# Patient Record
Sex: Female | Born: 1965 | Race: White | Hispanic: No | Smoking: Former smoker
Health system: Southern US, Community
[De-identification: ages and names within clinical notes are randomized; demographics above are authoritative.]

## PROBLEM LIST (undated history)

## (undated) DIAGNOSIS — K219 Gastro-esophageal reflux disease without esophagitis: Secondary | ICD-10-CM

## (undated) DIAGNOSIS — T7840XA Allergy, unspecified, initial encounter: Secondary | ICD-10-CM

## (undated) DIAGNOSIS — M1990 Unspecified osteoarthritis, unspecified site: Secondary | ICD-10-CM

## (undated) HISTORY — DX: Allergy, unspecified, initial encounter: T78.40XA

## (undated) HISTORY — PX: WISDOM TOOTH EXTRACTION: SHX21

## (undated) HISTORY — DX: Unspecified osteoarthritis, unspecified site: M19.90

## (undated) HISTORY — PX: NO PAST SURGERIES: SHX2092

## (undated) HISTORY — DX: Gastro-esophageal reflux disease without esophagitis: K21.9

---

## 2004-10-01 ENCOUNTER — Emergency Department: Payer: Self-pay | Admitting: Emergency Medicine

## 2009-03-16 ENCOUNTER — Emergency Department: Payer: Self-pay | Admitting: Emergency Medicine

## 2010-04-24 ENCOUNTER — Other Ambulatory Visit: Payer: Self-pay | Admitting: Family Medicine

## 2016-08-14 ENCOUNTER — Encounter: Payer: Self-pay | Admitting: Family Medicine

## 2018-05-12 ENCOUNTER — Other Ambulatory Visit: Payer: Self-pay

## 2018-05-12 ENCOUNTER — Encounter: Payer: Self-pay | Admitting: Emergency Medicine

## 2018-05-12 ENCOUNTER — Ambulatory Visit
Admission: EM | Admit: 2018-05-12 | Discharge: 2018-05-12 | Disposition: A | Discharge status: Home / Self Care | Payer: BLUE CROSS/BLUE SHIELD | Attending: Family Medicine | Admitting: Family Medicine

## 2018-05-12 ENCOUNTER — Ambulatory Visit (INDEPENDENT_AMBULATORY_CARE_PROVIDER_SITE_OTHER): Payer: BLUE CROSS/BLUE SHIELD

## 2018-05-12 DIAGNOSIS — M25561 Pain in right knee: Secondary | ICD-10-CM | POA: Diagnosis not present

## 2018-05-12 DIAGNOSIS — M2341 Loose body in knee, right knee: Secondary | ICD-10-CM

## 2018-05-12 DIAGNOSIS — M1711 Unilateral primary osteoarthritis, right knee: Secondary | ICD-10-CM | POA: Diagnosis not present

## 2018-05-12 MED ORDER — NAPROXEN 500 MG PO TABS: 500.0000 mg | ORAL_TABLET | Freq: Two times a day (BID) | ORAL | 0 refills | Status: DC

## 2018-05-12 NOTE — ED Triage Notes (Addendum)
Patient in today c/o right knee pain x 1 day. Patient states she has had this before, but not to this severity. Patient states she feels like something is "out of place". Patient has been icing, elevating, using knee brace and Ibuprofen without relief.

## 2018-05-12 NOTE — ED Provider Notes (Signed)
[facility]    CSN: [id number] Arrival date & time: 05/12/18  2426     History   Chief Complaint Chief Complaint  Patient presents with  . Knee Pain    right    HPI Julia Gallegos is a 52 y.o. female.   HPI  52 year old morbidly obese female presents with right knee pain that she has had for 1 day.  She sits work  but yesterday had to walk more than usual.  Since that time she has had knee pain which she cannot localize very well.  She states that anytime she walks on it it is very painful.  4 years ago she was seen by a PA who told her that she would need a total knee arthroplasty but she has been resisting having to do that.  She took anti-inflammatory medications which helped and she has not really had much of a problem over the last 4 years.  States that it does pop and click frequently.  He was using a cane yesterday for ambulation.  She started a job recently and states that she does not have time for any surgery.  She has been using ice and elevating and using a knee brace along with ibuprofen but has not really helped the knee very much.        History reviewed. No pertinent past medical history.  There are no active problems to display for this patient.   History reviewed. No pertinent surgical history.  OB History   None      Home Medications    Prior to Admission medications   Medication Sig Start Date End Date Taking? Authorizing Provider  naproxen (NAPROSYN) 500 MG tablet Take 1 tablet (500 mg total) by mouth 2 (two) times daily with a meal. 05/12/18   Lorin Picket, PA-C    Family History Family History  Problem Relation Age of Onset  . Pneumonia Mother 37  . COPD Father     Social History Social History   Tobacco Use  . Smoking status: Former Smoker    Last attempt to quit: 08/27/2016    Years since quitting: 1.7  . Smokeless tobacco: Never Used  Substance Use Topics  . Alcohol use: Yes    Comment: occassionally  . Drug  use: Never     Allergies   Vicodin [hydrocodone-acetaminophen]   Review of Systems Review of Systems  Constitutional: Positive for activity change. Negative for appetite change, chills, fatigue and fever.  Musculoskeletal: Positive for arthralgias and gait problem.  All other systems reviewed and are negative.    Physical Exam Triage Vital Signs ED Triage Vitals  Enc Vitals Group     BP 05/12/18 0845 138/80     Pulse Rate 05/12/18 0845 95     Resp 05/12/18 0845 16     Temp 05/12/18 0845 98.7 F (37.1 C)     Temp Source 05/12/18 0845 Oral     SpO2 05/12/18 0845 99 %     Weight 05/12/18 0845 240 lb (108.9 kg)     Height 05/12/18 0845 5\' 1"  (1.549 m)     Head Circumference --      Peak Flow --      Pain Score 05/12/18 0844 3     Pain Loc --      Pain Edu? --      Excl. in [place]? --    No data found.  Updated Vital Signs BP 138/80 (BP Location: Left Arm)  Pulse 95   Temp 98.7 F (37.1 C) (Oral)   Resp 16   Ht 5\' 1"  (1.549 m)   Wt 240 lb (108.9 kg)   SpO2 99%   BMI 45.35 kg/m   Visual Acuity Right Eye Distance:   Left Eye Distance:   Bilateral Distance:    Right Eye Near:   Left Eye Near:    Bilateral Near:     Physical Exam  Constitutional: She is oriented to person, place, and time. She appears well-developed and well-nourished. No distress.  HENT:  Head: Normocephalic.  Eyes: Pupils are equal, round, and reactive to light. Right eye exhibits no discharge. Left eye exhibits no discharge.  Neck: Normal range of motion.  Musculoskeletal: She exhibits tenderness.  Examination of the right knee shows  No significant effusion present.  There is good control of the quadriceps.  She lacks approximately 5 degrees of full extension due to discomfort.  Flexes to 90 degrees.  She has pain with patellar manipulation and retropatellar tenderness bilaterally.  There is tenderness along the lateral and medial joint lines.  Collateral ligaments are intact to stressing.   She has a negative anterior drawer sign.  Neurological: She is alert and oriented to person, place, and time.  Skin: Skin is warm and dry. She is not diaphoretic.  Psychiatric: She has a normal mood and affect. Her behavior is normal. Judgment and thought content normal.  Nursing note and vitals reviewed.    UC Treatments / Results  Labs (all labs ordered are listed, but only abnormal results are displayed) Labs Reviewed - No data to display  EKG None  Radiology Dg Knee Complete 4 Views Right  Result Date: 05/12/2018 CLINICAL DATA:  52 year old female with right knee pain for years. Osteoarthritis. Pain last 2 days. No reported injury. Initial encounter. EXAM: RIGHT KNEE - COMPLETE 4+ VIEW COMPARISON:  03/16/2009 plain film exam. FINDINGS: Marked medial tibiofemoral joint space and patellofemoral joint degenerative changes. Mild to moderate lateral tibiofemoral joint degenerative changes. Small loose body suspected measuring up to 9 mm. No fracture or dislocation. No significant joint effusion. IMPRESSION: Progressive degenerative changes when compared to 2010 exam including: Marked medial tibiofemoral joint space and patellofemoral joint degenerative changes. Mild to moderate lateral tibiofemoral joint degenerative changes. Small loose body suspected measuring up to 9 mm. Electronically Signed   By: Genia Del M.D.   On: 05/12/2018 09:44    Procedures Procedures (including critical care time)  Medications Ordered in UC Medications - No data to display  Initial Impression / Assessment and Plan / UC Course  I have reviewed the triage vital signs and the nursing notes.  Pertinent labs & imaging results that were available during my care of the patient were reviewed by me and considered in my medical decision making (see chart for details).   Had a long discussion with the patient regarding the findings and her osteoarthritis of the knee.  Advised she will eventually require a total  knee arthroplasty.  However she is reluctant to have this done at this time due mainly to her new job and down time required for rehab.  Not want to undergo steroid injections in the knee at this time either.  I will place her on Naprosyn daily basis and recommend that she take Prilosec or Prevacid for gastric protection.  Do use a cane for ambulation.  Use it in the opposite hand.  Commended that she follow-up with an orthopedic surgeon for ongoing care and timing  of a total knee arthroplasty   Final Clinical Impressions(s) / UC Diagnoses   Final diagnoses:  Primary osteoarthritis of right knee  Loose body of knee, right     Discharge Instructions     Use cane for ambulation.Apply ice 20 minutes out of every 2 hours 4-5 times daily for comfort.  Follow up with orthopedic surgery    ED Prescriptions    Medication Sig Dispense Auth. Provider   naproxen (NAPROSYN) 500 MG tablet Take 1 tablet (500 mg total) by mouth 2 (two) times daily with a meal. 60 tablet Lorin Picket, PA-C     Controlled Substance Prescriptions [place] Controlled Substance Registry consulted? Not Applicable   Lorin Picket, PA-C 05/12/18 1052

## 2018-05-12 NOTE — Discharge Instructions (Signed)
Use cane for ambulation.Apply ice 20 minutes out of every 2 hours 4-5 times daily for comfort.  Follow up with orthopedic surgery

## 2018-05-25 ENCOUNTER — Encounter: Payer: Self-pay | Admitting: Emergency Medicine

## 2018-05-25 ENCOUNTER — Ambulatory Visit
Admission: EM | Admit: 2018-05-25 | Discharge: 2018-05-25 | Disposition: A | Discharge status: Home / Self Care | Payer: BLUE CROSS/BLUE SHIELD | Attending: Family Medicine | Admitting: Family Medicine

## 2018-05-25 ENCOUNTER — Ambulatory Visit (INDEPENDENT_AMBULATORY_CARE_PROVIDER_SITE_OTHER): Payer: BLUE CROSS/BLUE SHIELD

## 2018-05-25 ENCOUNTER — Other Ambulatory Visit: Payer: Self-pay

## 2018-05-25 DIAGNOSIS — S43421A Sprain of right rotator cuff capsule, initial encounter: Secondary | ICD-10-CM

## 2018-05-25 DIAGNOSIS — W19XXXA Unspecified fall, initial encounter: Secondary | ICD-10-CM

## 2018-05-25 DIAGNOSIS — M25511 Pain in right shoulder: Secondary | ICD-10-CM

## 2018-05-25 DIAGNOSIS — M79601 Pain in right arm: Secondary | ICD-10-CM | POA: Diagnosis not present

## 2018-05-25 NOTE — Discharge Instructions (Addendum)
Apply ice 20 minutes out of every 2 hours 4-5 times daily for comfort.  Perform pendulum exercises as was demonstrated to you a minimum of 3 times daily for 1 to 2 minutes each time.  Avoid frequent or repetitive use of the shoulder until seen by orthopedics.

## 2018-05-25 NOTE — ED Provider Notes (Signed)
[facility]    CSN: [id number] Arrival date & time: 05/25/18  1813     History   Chief Complaint Chief Complaint  Patient presents with  . Shoulder Pain    right    HPI Julia Gallegos is a 52 y.o. female.   HPI  52 year old female to the clinic presents today right arm and shoulder pain.  She states that she misstep on concrete tripped and fell onto her right arm while leaving work today.  Approximately 5:00  pm  She is unable to lift her arm.  She is right-hand dominant.  Pain is over the proximal humerus also in the sub-deltoid area; good pulses and sensation distally.       History reviewed. No pertinent past medical history.  There are no active problems to display for this patient.   Past Surgical History:  Procedure Laterality Date  . NO PAST SURGERIES      OB History   None      Home Medications    Prior to Admission medications   Medication Sig Start Date End Date Taking? Authorizing Provider  ibuprofen (ADVIL) 200 MG tablet Take 200 mg by mouth every 6 (six) hours as needed.   Yes [provider]    Family History Family History  Problem Relation Age of Onset  . Pneumonia Mother 56  . COPD Father     Social History Social History   Tobacco Use  . Smoking status: Former Smoker    Last attempt to quit: 08/27/2016    Years since quitting: 1.7  . Smokeless tobacco: Never Used  Substance Use Topics  . Alcohol use: Yes    Comment: occassionally  . Drug use: Never     Allergies   Vicodin [hydrocodone-acetaminophen]   Review of Systems Review of Systems  Constitutional: Positive for activity change. Negative for appetite change, chills, fatigue and fever.  Musculoskeletal: Positive for arthralgias and myalgias.  All other systems reviewed and are negative.    Physical Exam Triage Vital Signs ED Triage Vitals  Enc Vitals Group     BP 05/25/18 1827 140/75     Pulse --      Resp 05/25/18 1827 17     Temp  05/25/18 1827 98.4 F (36.9 C)     Temp Source 05/25/18 1827 Oral     SpO2 05/25/18 1827 100 %     Weight 05/25/18 1826 230 lb (104.3 kg)     Height 05/25/18 1826 5' (1.524 m)     Head Circumference --      Peak Flow --      Pain Score 05/25/18 1825 4     Pain Loc --      Pain Edu? --      Excl. in [place]? --    No data found.  Updated Vital Signs BP 140/75 (BP Location: Left Arm)   Temp 98.4 F (36.9 C) (Oral)   Resp 17   Ht 5' (1.524 m)   Wt 230 lb (104.3 kg)   SpO2 100%   BMI 44.92 kg/m   Visual Acuity Right Eye Distance:   Left Eye Distance:   Bilateral Distance:    Right Eye Near:   Left Eye Near:    Bilateral Near:     Physical Exam  Constitutional: She is oriented to person, place, and time. She appears well-developed and well-nourished. No distress.  HENT:  Head: Normocephalic.  Eyes: Pupils are equal, round, and reactive to light.  EOM are normal.  Neck: Normal range of motion. Neck supple.  Musculoskeletal: She exhibits tenderness.  Is holding the right arm at her side with resistance to any motion.  Scapula is nontender and no deformity is seen.  The acromion is also intact with no deformity no pain.  Subacromial is mild tenderness but not a lot.  She has most tenderness at the insertion of the deltoid on the humerus.  She is able to passively allow small amount of range of motion of the shoulder.  Sensation and pulses are intact distally.  Will not actively move the shoulder through range of motion.  Neurological: She is alert and oriented to person, place, and time.  Skin: Skin is warm and dry. She is not diaphoretic.  Psychiatric: She has a normal mood and affect. Her behavior is normal. Judgment and thought content normal.  Nursing note and vitals reviewed.    UC Treatments / Results  Labs (all labs ordered are listed, but only abnormal results are displayed) Labs Reviewed - No data to display  EKG None  Radiology Dg Shoulder Right  Result Date:  05/25/2018 CLINICAL DATA:  [place] onto right shoulder EXAM: RIGHT SHOULDER - 2+ VIEW COMPARISON:  None. FINDINGS: Right lung apex is clear. AC joint is intact. No fracture or malalignment. IMPRESSION: No acute osseous abnormality. Electronically Signed   By: Donavan Foil M.D.   On: 05/25/2018 19:15   Dg Humerus Right  Result Date: 05/25/2018 CLINICAL DATA:  Fall onto right arm EXAM: RIGHT HUMERUS - 2+ VIEW COMPARISON:  None. FINDINGS: There is no evidence of fracture or other focal bone lesions. Soft tissues are unremarkable. IMPRESSION: Negative. Electronically Signed   By: Donavan Foil M.D.   On: 05/25/2018 19:15    Procedures Procedures (including critical care time)  Medications Ordered in UC Medications - No data to display  Initial Impression / Assessment and Plan / UC Course  I have reviewed the triage vital signs and the nursing notes.  Pertinent labs & imaging results that were available during my care of the patient were reviewed by me and considered in my medical decision making (see chart for details).     Plan: 1. Test/x-ray results and diagnosis reviewed with patient 2. rx as per orders; risks, benefits, potential side effects reviewed with patient 3. Recommend supportive treatment with ice to the shoulder 20 minutes out of every 2 hours 4-5 times daily.  Use a sling for comfort.  Recommend sitting in a recliner chair for sleep.  Directed and pendulum exercises that she will perform at least 3 times daily for 1 to 2 minutes each time.  Also recommend following up with orthopedic surgery since the exam today is too painful to perform adequately.  She  ironically has an appointment tomorrow with the orthopedic surgeon for her knee 4. F/u prn if symptoms worsen or don't improve  Final Clinical Impressions(s) / UC Diagnoses   Final diagnoses:  Fall, initial encounter  Sprain of right rotator cuff capsule, initial encounter     Discharge Instructions     Apply ice 20  minutes out of every 2 hours 4-5 times daily for comfort.  Perform pendulum exercises as was demonstrated to you a minimum of 3 times daily for 1 to 2 minutes each time.  Avoid frequent or repetitive use of the shoulder until seen by orthopedics.    ED Prescriptions    None     Controlled Substance Prescriptions [place] Controlled Substance Registry consulted?  Not Applicable   Lorin Picket, PA-C 05/25/18 1950

## 2018-05-25 NOTE — ED Triage Notes (Signed)
Pt here today c/o right arm/shoulder pain. She fell on concrete on her right arm while leaving work. She can not lift up her arm she has to use the other arm to assist it.

## 2018-06-26 ENCOUNTER — Ambulatory Visit (INDEPENDENT_AMBULATORY_CARE_PROVIDER_SITE_OTHER): Payer: BLUE CROSS/BLUE SHIELD

## 2018-06-26 ENCOUNTER — Other Ambulatory Visit: Payer: Self-pay

## 2018-06-26 ENCOUNTER — Ambulatory Visit
Admission: EM | Admit: 2018-06-26 | Discharge: 2018-06-26 | Disposition: A | Discharge status: Home / Self Care | Payer: BLUE CROSS/BLUE SHIELD | Attending: Family Medicine | Admitting: Family Medicine

## 2018-06-26 DIAGNOSIS — M1712 Unilateral primary osteoarthritis, left knee: Secondary | ICD-10-CM

## 2018-06-26 DIAGNOSIS — M25462 Effusion, left knee: Secondary | ICD-10-CM

## 2018-06-26 DIAGNOSIS — M25562 Pain in left knee: Secondary | ICD-10-CM

## 2018-06-26 MED ORDER — MELOXICAM 15 MG PO TABS: 15.0000 mg | ORAL_TABLET | Freq: Every day | ORAL | 0 refills | Status: DC | PRN

## 2018-06-26 NOTE — ED Triage Notes (Signed)
Patient complains of left knee pain that started over the weekend. Patient states that Monday night she was walking with it and heard a pop. Patient states that she has bad knees in general. Patient states that this feels different.

## 2018-06-26 NOTE — Discharge Instructions (Addendum)
Take medication as prescribed. Rest. Drink plenty of fluids.  Continue to use crutches and tolerate weight as able.  Call today to schedule follow-up with orthopedic.  Orthopedic follow-up is important.  Follow up with your primary care physician this week as needed. Return to Urgent care for new or worsening concerns.

## 2018-06-26 NOTE — ED Provider Notes (Signed)
[facility] ____________________________________________  Time seen: Approximately 9:11 AM  I have reviewed the triage vital signs and the nursing notes.   HISTORY  Chief Complaint Knee Pain (left)   HPI Julia Gallegos is a 52 y.o. female presenting with spouse at bedside for evaluation of left knee pain is been present since this past Saturday night.  States she is gotten up to go to the restroom that night, when getting up from a seated position she heard a pop to her left knee with associated pain.  States Sunday she was able to continue weightbearing but with mild pain.  States since Tuesday she has not been able to really weight-bear.  States that she has had continued pain to her left knee with difficulty weightbearing.  Unsure if knee is unstable feeling but just more pain.  Denies any fall or trauma directly to the knee.  States that she knows she has "bad knees "and that she has diffuse arthritis to her right knee and needs a knee replacement.  Previously followed with Dr.[facility].  Has been using crutches.  Has been taken over-the-counter ibuprofen without much change as well as elevating applying ice.  Denies pain radiation, paresthesias, other pain or swelling.  States feels like there is some swelling to the knee itself but not otherwise.  Denies other aggravating or alleviating factors.  Reports otherwise feels well.  History reviewed. No pertinent past medical history. Denies   There are no active problems to display for this patient.   Past Surgical History:  Procedure Laterality Date  . NO PAST SURGERIES       No current facility-administered medications for this encounter.   Current Outpatient Medications:  .  ibuprofen (ADVIL) 200 MG tablet, Take 200 mg by mouth every 6 (six) hours as needed., Disp: , Rfl:  .  meloxicam (MOBIC) 15 MG tablet, Take 1 tablet (15 mg total) by mouth daily as needed., Disp: 10 tablet, Rfl: 0  Allergies Vicodin  [hydrocodone-acetaminophen]  Family History  Problem Relation Age of Onset  . Pneumonia Mother 37  . COPD Father     Social History Social History   Tobacco Use  . Smoking status: Former Smoker    Last attempt to quit: 08/27/2016    Years since quitting: 1.8  . Smokeless tobacco: Never Used  Substance Use Topics  . Alcohol use: Yes    Comment: occasionally  . Drug use: Never    Review of Systems Constitutional: No fever/chills ENT: No sore throat. Cardiovascular: Denies chest pain. Respiratory: Denies shortness of breath. Gastrointestinal: No abdominal pain.   Musculoskeletal: Negative for back pain. As above.  Skin: Negative for rash.   ____________________________________________   PHYSICAL EXAM:  VITAL SIGNS: ED Triage Vitals  Enc Vitals Group     BP 06/26/18 0847 (!) 145/83     Pulse Rate 06/26/18 0847 (!) 105     Resp 06/26/18 0847 18     Temp 06/26/18 0847 98.7 F (37.1 C)     Temp Source 06/26/18 0847 Oral     SpO2 06/26/18 0847 98 %     Weight 06/26/18 0845 229 lb 15.9 oz (104.3 kg)     Height 06/26/18 0845 5' (1.524 m)     Head Circumference --      Peak Flow --      Pain Score 06/26/18 0844 10     Pain Loc --      Pain Edu? --  Excl. in [place]? --     Constitutional: Alert and oriented. Well appearing and in no acute distress. ENT      Head: Normocephalic and atraumatic. Cardiovascular: Normal rate, regular rhythm. Grossly normal heart sounds.  Good peripheral circulation. Respiratory: Normal respiratory effort without tachypnea nor retractions. Breath sounds are clear and equal bilaterally. No wheezes, rales, rhonchi. Musculoskeletal:  Bilateral pedal pulses equal and easily palpated. Left lateral knee diffuse tenderness to palpation and palpable effusion, mild pain with anterior and posterior drawer but no instability noted, no pain with medial lateral stress, no pain or restriction with flexion, mild pain with knee extension and slightly  limited extension, left lower extremity otherwise nontender no other edema noted.  Gait not tested due to pain. Neurologic:  Normal speech and language.  Speech is normal.  Skin:  Skin is warm, dry and intact. Psychiatric: Mood and affect are normal. Speech and behavior are normal. Patient exhibits appropriate insight and judgment   ___________________________________________   LABS (all labs ordered are listed, but only abnormal results are displayed)  Labs Reviewed - No data to display  RADIOLOGY  Dg Knee Complete 4 Views Left  Result Date: 06/26/2018 CLINICAL DATA:  Left knee pain. EXAM: LEFT KNEE - COMPLETE 4+ VIEW COMPARISON:  No recent prior. FINDINGS: Severe tricompartment degenerative change. No acute bony abnormality. No evidence of fracture or dislocation. Moderate knee joint effusion. IMPRESSION: Severe tricompartment degenerative change. No acute bony abnormality identified. No evidence of fracture dislocation. Moderate knee joint effusion. Electronically Signed   By: Marcello Moores  Register   On: 06/26/2018 09:59   ____________________________________________   PROCEDURES Procedures    INITIAL IMPRESSION / ASSESSMENT AND PLAN / ED COURSE  Pertinent labs & imaging results that were available during my care of the patient were reviewed by me and considered in my medical decision making (see chart for details).  Well-appearing patient.  No acute distress.  Left knee pain after getting up from sitting position with pop.  Difficulty weightbearing.  X-ray as above with joint effusion and tricompartmental arthritis.  Discussed pain associated to osteoarthritis, but also concern for ligamentous and meniscal injury.  Patient previously seen Dr. Marry Guan, recommend orthopedic follow-up.  Continue crutches as needed.  Will start on daily Mobic, ice and elevation.Discussed indication, risks and benefits of medications with patient.  Discussed follow up with Primary care physician this week as  needed. Discussed follow up and return parameters including no resolution or any worsening concerns. Patient verbalized understanding and agreed to plan.   ____________________________________________   FINAL CLINICAL IMPRESSION(S) / ED DIAGNOSES  Final diagnoses:  Acute pain of left knee  Osteoarthritis of left knee, unspecified osteoarthritis type     ED Discharge Orders         Ordered    meloxicam (MOBIC) 15 MG tablet  Daily PRN     06/26/18 1003           Note: This dictation was prepared with Dragon dictation along with smaller phrase technology. Any transcriptional errors that result from this process are unintentional.         Marylene Land, NP 06/26/18 1126

## 2019-01-24 IMAGING — CR DG KNEE COMPLETE 4+V*L*
4 series · 4 of 4 positions shown · non-contrast
Comparison: No recent prior.

CLINICAL DATA: Left knee pain.

EXAM:
LEFT KNEE - COMPLETE 4+ VIEW

[knee ap (1 of 3)]
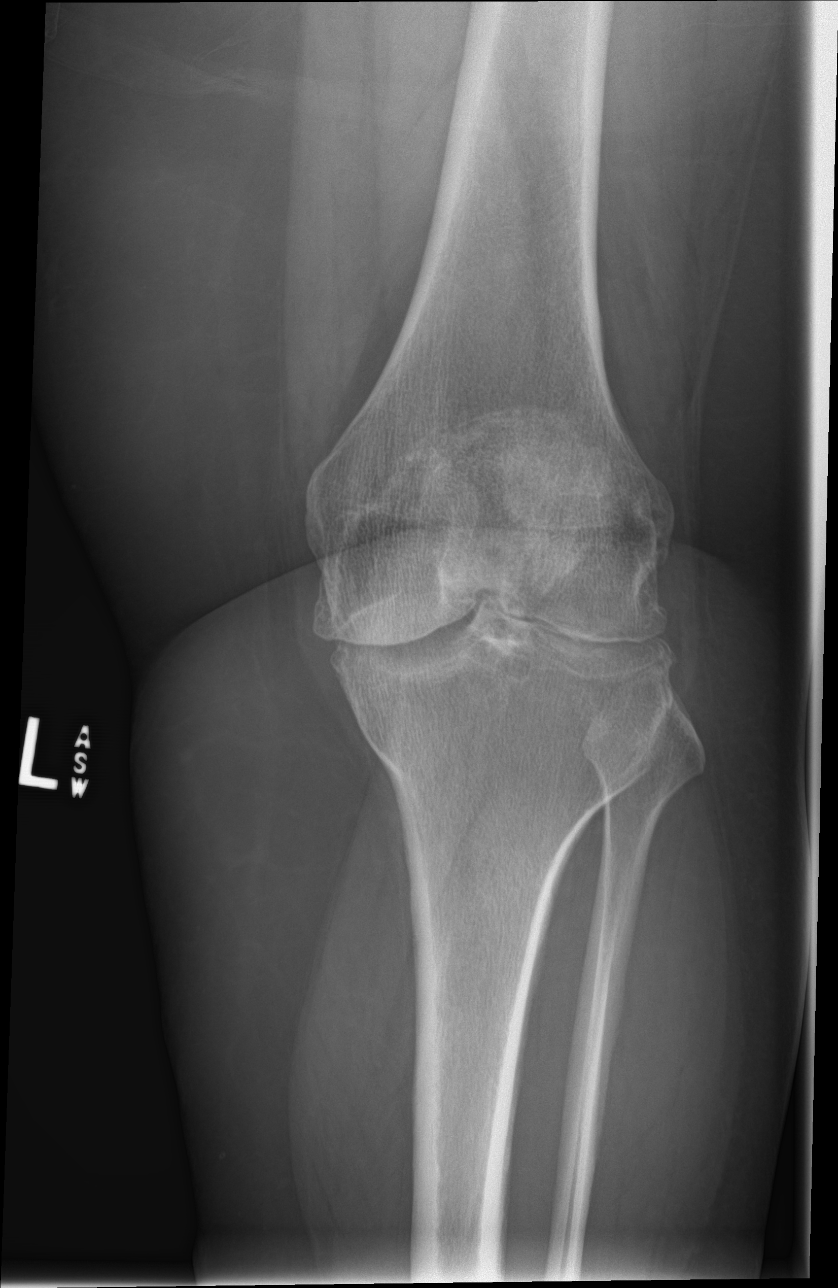

[tunnel]
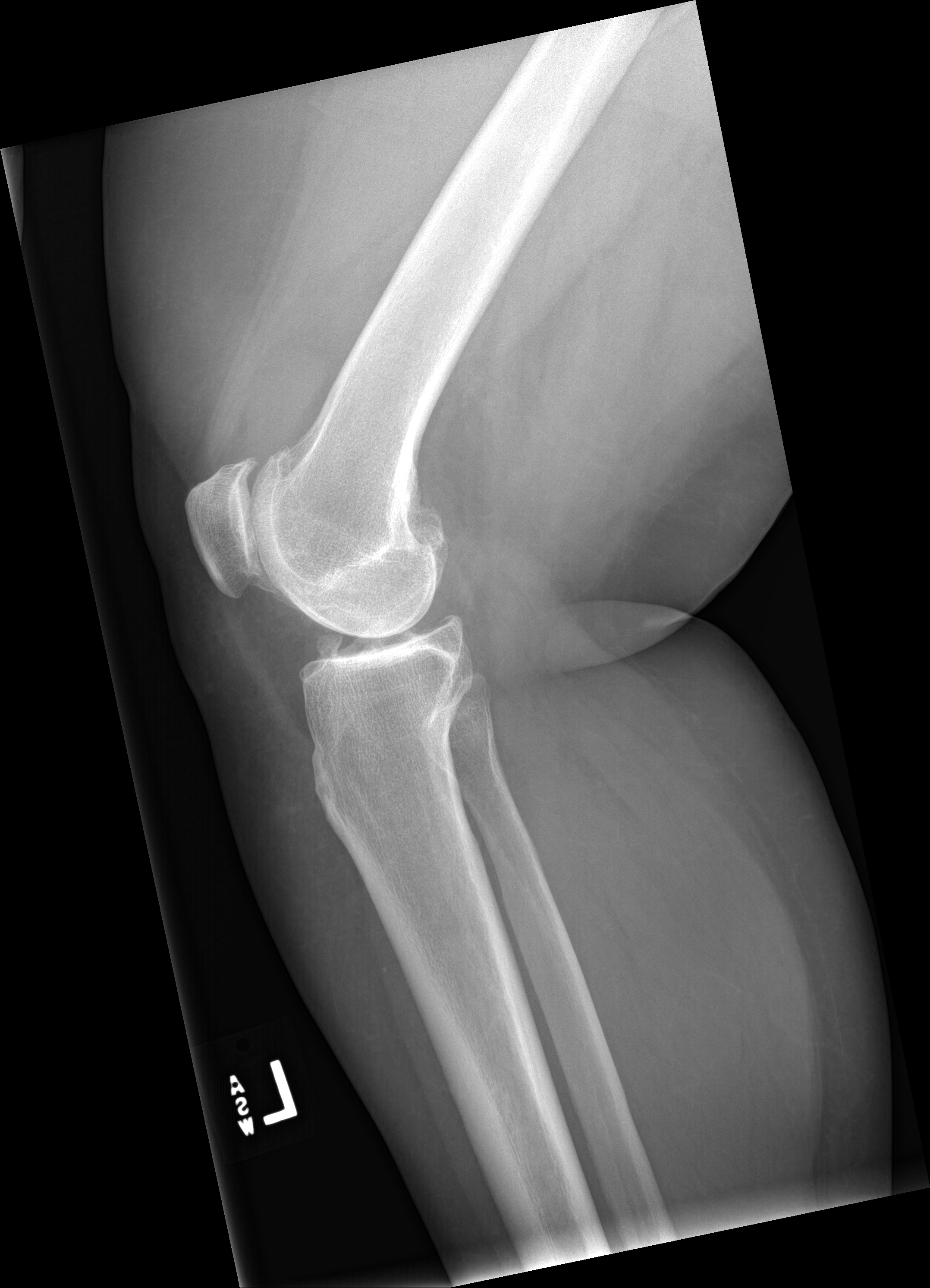

[knee ap (2 of 3)]
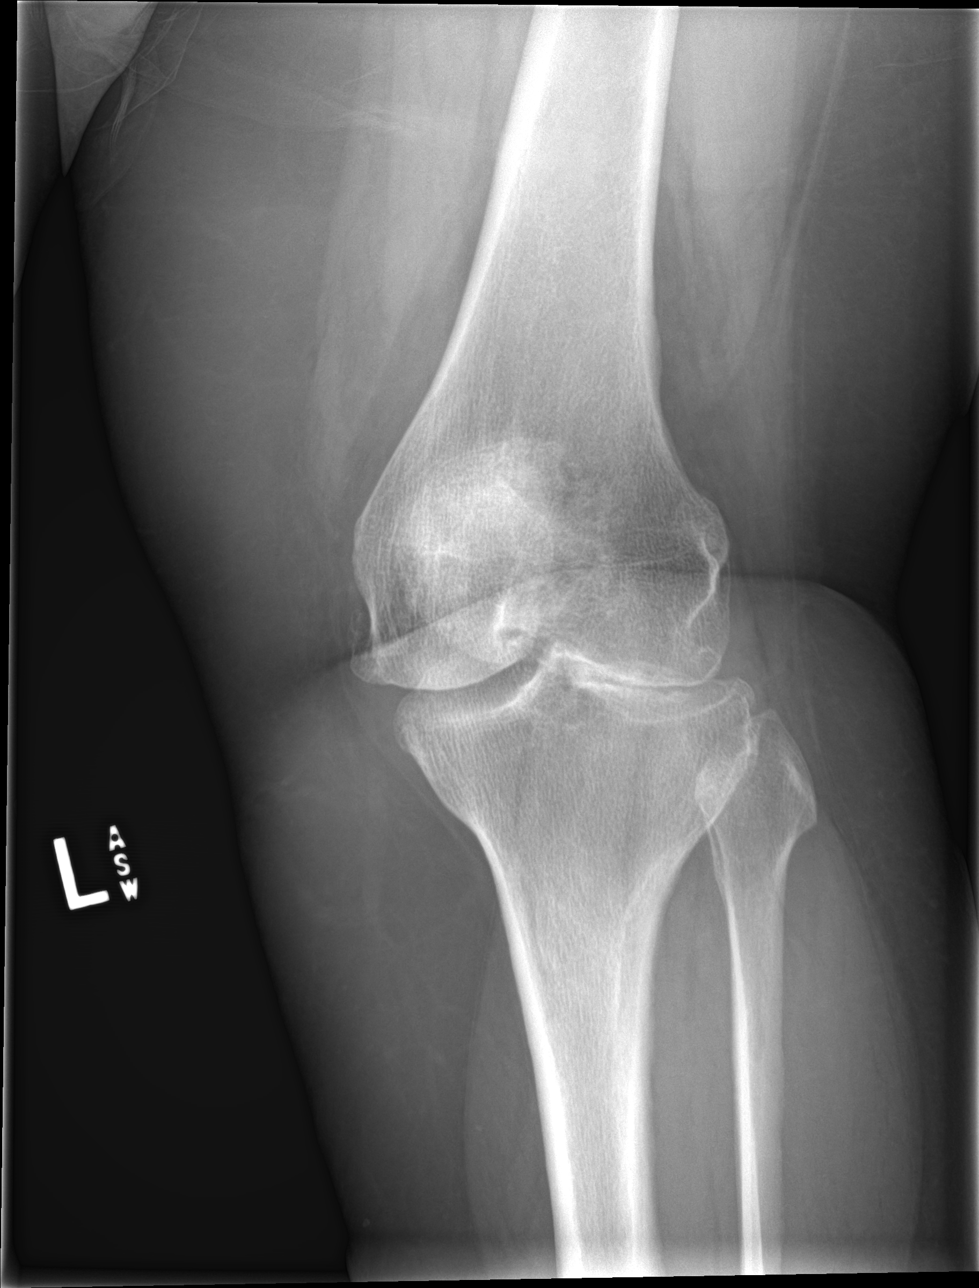

[knee ap (3 of 3)]
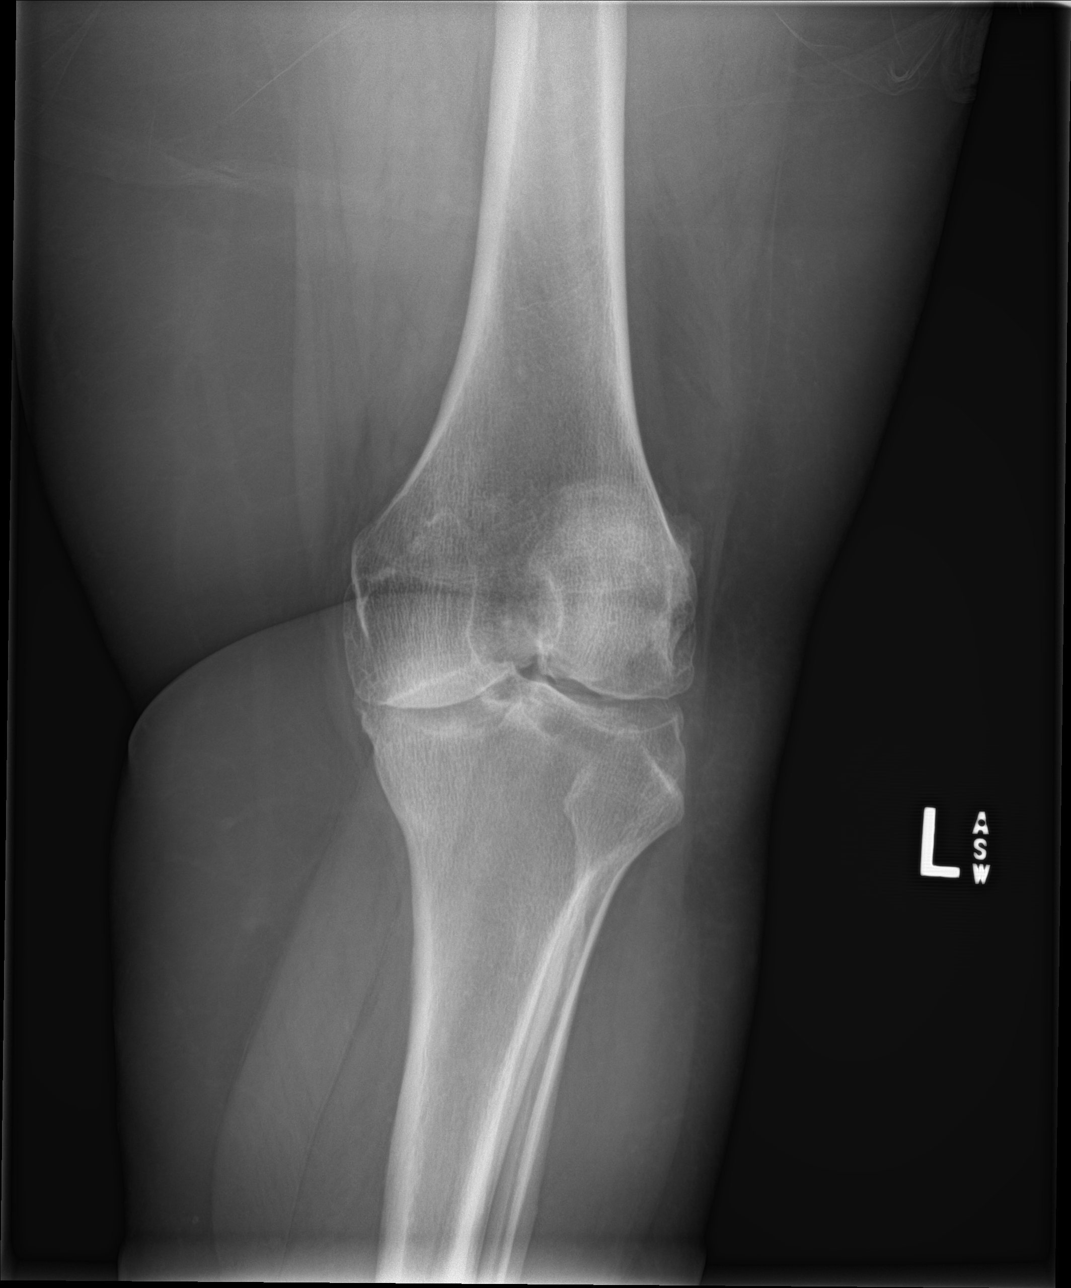

[4 of 4 positions shown; findings below may reference images not displayed]

FINDINGS: Severe tricompartment degenerative change. No acute bony
abnormality. No evidence of fracture or dislocation. Moderate knee
joint effusion.
IMPRESSION: Severe tricompartment degenerative change. No acute bony abnormality
identified. No evidence of fracture dislocation. Moderate knee joint
effusion..

## 2019-05-05 ENCOUNTER — Telehealth: Payer: Self-pay | Admitting: Obstetrics and Gynecology

## 2019-05-05 NOTE — Telephone Encounter (Signed)
Patient scheduled 8/19 for nexplanon replacement with [facility] in [place].

## 2019-05-06 NOTE — Telephone Encounter (Signed)
Noted. Will order to arrive by apt date/time. 

## 2019-06-16 ENCOUNTER — Other Ambulatory Visit: Payer: Self-pay

## 2019-06-16 ENCOUNTER — Ambulatory Visit: Payer: BC Managed Care – PPO | Admitting: Obstetrics and Gynecology

## 2019-06-16 ENCOUNTER — Encounter: Payer: Self-pay | Admitting: Obstetrics and Gynecology

## 2019-06-16 VITALS — BP 147/92 | Wt 248.0 lb

## 2019-06-16 DIAGNOSIS — Z3049 Encounter for surveillance of other contraceptives: Secondary | ICD-10-CM | POA: Diagnosis not present

## 2019-06-16 DIAGNOSIS — Z30017 Encounter for initial prescription of implantable subdermal contraceptive: Secondary | ICD-10-CM

## 2019-06-16 DIAGNOSIS — Z3046 Encounter for surveillance of implantable subdermal contraceptive: Secondary | ICD-10-CM

## 2019-06-16 MED ORDER — ETONOGESTREL 68 MG ~~LOC~~ IMPL: 1.0000 | DRUG_IMPLANT | Freq: Once | SUBCUTANEOUS | 0 refills | Status: DC

## 2019-06-16 NOTE — Progress Notes (Signed)
GYNECOLOGY PROCEDURE NOTE (Nexplanon removal)  Nexplanon removal discussed in detail.  Risks of infection, bleeding, nerve injury all reviewed.  Patient understands risks and desires to proceed.  Verbal consent obtained.  Patient is certain she wants the Nexplanon removed.  All questions answered.  Procedure: Patient placed in dorsal supine with left arm above head, elbow flexed at 90 degrees, arm resting on examination table.  Nexplanon identified without problems.  Betadine scrub x3.  1 ml of 1% lidocaine injected under Nexplanon device without problems.  Sterile gloves applied.  Small 0.5cm incision made at distal tip of Nexplanon device with 11 blade scalpel.  Nexplanon brought to incision and grasped with a small kelly clamp.  Nexplanon removed intact without problems.      GYNECOLOGY PROCEDURE NOTE (Nexplanon insertion)  Patient is a 53 y.o. G2P2 presenting for Nexplanon insertion as her desires means of contraception.  She provided informed consent, signed copy in the chart, time out was performed. Pregnancy test was not done, with self reported LMP of No LMP recorded. Patient has had an implant.  She understands that Nexplanon is a progesterone only therapy, and that patients often patients have irregular and unpredictable vaginal bleeding or amenorrhea. She understands that other side effects are possible related to systemic progesterone, including but not limited to, headaches, breast tenderness, nausea, and irritability. While effective at preventing pregnancy long acting reversible contraceptives do not prevent transmission of sexually transmitted diseases and use of barrier methods for this purpose was discussed. The placement procedure for Nexplanon was reviewed with the patient in detail including risks of nerve injury, infection, bleeding and injury to other muscles or tendons. She understands that the Nexplanon implant is good for 3 years and needs to be removed at the end of  that time.  She understands that Nexplanon is an extremely effective option for contraception, with failure rate of <1%. This information is reviewed today and all questions were answered. Informed consent was obtained, both verbally and written.   The patient is healthy and has no contraindications to Nexplanon use. Urine pregnancy test was performed today and was negative.  Procedure Appropriate time out taken.  Patient already placed in dorsal supine with left arm above head, elbow flexed at 90 degrees, arm resting on examination table with hand behind her head.  The patient was counseled extensively regarding using the new insertion site location versus simply placing the device where the old device was.  She strongly wanted the device placed in the same incision without making a new incision.  The insertion site was previuosly prepped with two betadine swabs and then injected with and addition 2 mL of 1% lidocaine without epinephrine.  Nexplanon removed form sterile blister packaging,  Device confirmed in needle, before inserting full length of needle, tenting up the skin as the needle was advance.  The drug eluting rod was then deployed by pulling back the slider per the manufactures recommendation.  The implant was palpable by the clinician as well as the patient.  The insertion site covered dressed with surgical skin glue (patient states she has a strong allergy to adhesives) before applying  a kerlex bandage pressure dressing.  Minimal blood loss was noted during the procedure.  The patient tolerated the procedure well.   She was instructed to wear the bandage for 24 hours, call with any signs of infection.  She was given the Nexplanon card and instructed to have the rod removed in 3 years.  Prentice Docker,  MD, [facility], [facility] 06/16/2019 2:08 PM

## 2019-11-02 ENCOUNTER — Other Ambulatory Visit: Payer: Self-pay

## 2019-11-02 ENCOUNTER — Encounter: Payer: Self-pay | Admitting: Emergency Medicine

## 2019-11-02 ENCOUNTER — Ambulatory Visit
Admission: EM | Admit: 2019-11-02 | Discharge: 2019-11-02 | Disposition: A | Discharge status: Home / Self Care | Payer: BC Managed Care – PPO | Attending: Emergency Medicine | Admitting: Emergency Medicine

## 2019-11-02 DIAGNOSIS — Z20822 Contact with and (suspected) exposure to covid-19: Secondary | ICD-10-CM | POA: Diagnosis not present

## 2019-11-02 DIAGNOSIS — J069 Acute upper respiratory infection, unspecified: Secondary | ICD-10-CM | POA: Insufficient documentation

## 2019-11-02 MED ORDER — IPRATROPIUM BROMIDE 0.06 % NA SOLN: 2.0000 | Freq: Four times a day (QID) | NASAL | 0 refills | Status: DC

## 2019-11-02 NOTE — ED Provider Notes (Signed)
HPI  SUBJECTIVE:  Julia Gallegos is a 54 y.o. female who presents with mild dull headache starting over the weekend.  She reports having "sinuses" yesterday with nasal congestion, clear rhinorrhea, postnasal drip.  She reports sore throat and cough secondary to the postnasal drip.  No fevers, body aches, loss of sense of smell or taste, shortness of breath, nausea, vomiting, diarrhea, abdominal pain.  No known exposure to Covid.  No sinus pain or pressure, facial swelling, upper dental pain.  She states that her sinuses "feel raw".  No allergy symptoms.  no Antibiotics in the past month.  No antipyretic in the past 4 to 6 hours.  She tried Sudafed, saline spray, Vicks.  Ibuprofen with improvement in her symptoms.  She also took Tylenol PM last night.  No aggravating factors.  She is a former smoker.  States that she gets a sinus infection once a year.  No history of pulmonary disease, diabetes, coronary disease, chronic kidney disease, HIV, cancer, immunocompromise.  PMD: None.    Past Medical History:  Diagnosis Date  . Osteoarthritis     Past Surgical History:  Procedure Laterality Date  . NO PAST SURGERIES      Family History  Problem Relation Age of Onset  . Pneumonia Mother 23  . COPD Father     Social History   Tobacco Use  . Smoking status: Former Smoker    Quit date: 08/27/2016    Years since quitting: 3.1  . Smokeless tobacco: Never Used  Substance Use Topics  . Alcohol use: Yes    Comment: occasionally  . Drug use: Never    No current facility-administered medications for this encounter.  Current Outpatient Medications:  .  esomeprazole (NEXIUM) 40 MG capsule, Take 40 mg by mouth daily at 12 noon., Disp: , Rfl:  .  etonogestrel (NEXPLANON) 68 MG IMPL implant, 1 each (68 mg total) by Subdermal route once for 1 dose., Disp: 1 each, Rfl: 0 .  fexofenadine (ALLEGRA) 180 MG tablet, Take 180 mg by mouth daily., Disp: , Rfl:  .  ipratropium (ATROVENT) 0.06 % nasal spray,  Place 2 sprays into both nostrils 4 (four) times daily., Disp: 15 mL, Rfl: 0  Allergies  Allergen Reactions  . Vicodin [Hydrocodone-Acetaminophen] Itching     ROS  As noted in HPI.   Physical Exam  Pulse (!) 101   Temp 98.9 F (37.2 C) (Oral)   Resp 18   Ht 5\' 1"  (1.549 m)   Wt 113.4 kg   SpO2 99%   BMI 47.24 kg/m   Constitutional: Well developed, well nourished, no acute distress Eyes:  EOMI, conjunctiva normal bilaterally HENT: Normocephalic, atraumatic,mucus membranes moist.  Erythematous, not swollen turbinates.  Mild nasal congestion.  Positive cobblestoning.  No obvious postnasal drip.  No maxillary, frontal sinus tenderness.   Respiratory: Normal inspiratory effort Cardiovascular: Normal rate GI: nondistended skin: No rash, skin intact Musculoskeletal: no deformities Neurologic: Alert & oriented x 3, no focal neuro deficits Psychiatric: Speech and behavior appropriate   ED Course   Medications - No data to display  Orders Placed This Encounter  Procedures  . Novel Coronavirus, NAA (Hosp order, Send-out to Ref Lab; TAT 18-24 hrs    Standing Status:   Standing    Number of Occurrences:   1    Order Specific Question:   Is this test for diagnosis or screening    Answer:   Diagnosis of ill patient    Order Specific Question:  Symptomatic for COVID-19 as defined by CDC    Answer:   Yes    Order Specific Question:   Date of Symptom Onset    Answer:   11/01/2019    Order Specific Question:   Hospitalized for COVID-19    Answer:   Yes    Order Specific Question:   Admitted to ICU for COVID-19    Answer:   No    Order Specific Question:   Previously tested for COVID-19    Answer:   No    Order Specific Question:   Resident in a congregate (group) care setting    Answer:   No    Order Specific Question:   Employed in healthcare setting    Answer:   No    Order Specific Question:   Pregnant    Answer:   No    No results found for this or any previous visit  (from the past 24 hour(s)). No results found.  ED Clinical Impression  1. Acute upper respiratory infection   2. Encounter for laboratory testing for COVID-19 virus      ED Assessment/Plan  Covid PCR sent.  Patient with URI.  Will treat supportively.  She has no indications for antibiotics at this time.  Will send home with Atrovent nasal spray, saline nasal irrigation, Mucinex or Mucinex D as needed, follow-up primary care physician of her choice for routine care.  Providing primary care list.   Discussed MDM, treatment plan, and plan for follow-up with patient.patient agrees with plan.   Meds ordered this encounter  Medications  . ipratropium (ATROVENT) 0.06 % nasal spray    Sig: Place 2 sprays into both nostrils 4 (four) times daily.    Dispense:  15 mL    Refill:  0    *This clinic note was created using [employer]. Therefore, there may be occasional mistakes despite careful proofreading.   ?    Melynda Ripple, MD 11/02/19 [phone]

## 2019-11-02 NOTE — Discharge Instructions (Addendum)
Saline nasal irrigation with a Milta Deiters med rinse and distilled water as often as you want.  The Atrovent nasal spray will stop your postnasal drip.  You may try Mucinex or Mucinex D if the nasal congestion returns.   Here is a list of primary care providers who are taking new patients:  Dr. Otilio Miu, Dr. Adline Potter [address] [place] [address] [place] [address]  [phone]  [facility] [address] [place], [place] [address] [phone]  [facility]  [phone] [place], [place] [address]  Here are clinics/ other resources who will see you if you do not have insurance. Some have certain criteria that you must meet. Call them and find out what they are:  [facility]: [address], [place], [place] [address] Phone: [phone] Hours: First and Third Saturdays of each Month, 9 a.m. - 1 p.m.  [facility]: [address], [place], [place], [place] [address] Phone: [phone] Hours: Tuesday, 4 p.m. - 8 p.m. Thursday, 1 p.m. - 8 p.m. Wednesday, 9 a.m. - [facility] [address], [place], [place] [address] Phone: [phone] Pharmacy Phone Number: [phone] Dental Phone Number: [phone] [place] Help: [phone]  Dental Hours: Monday - Thursday, 8 a.m. - 6 p.m.  [facility] [address], [place], [place] [address] Phone: [phone] Pharmacy Phone Number: [phone] [facility] Insurance Help: [phone]  [facility] [address] [place], [place] [address] Phone: [phone] Pharmacy Phone Number: [phone] [facility] Insurance Help: [phone]  [facility] [address] [place], [place] [address] Phone: [phone] [facility] Insurance Help: [phone]   [address] [place], [place]  [address] Phone: [phone]  Go to www.goodrx.com to look up your medications. This will give you a list of where you can find your prescriptions at the most affordable prices. Or ask the pharmacist what the cash price is, or if they have any other discount programs available to help make your medication more affordable. This can be less expensive than what you would pay with insurance.

## 2019-11-02 NOTE — ED Triage Notes (Signed)
Pt c/o nasal congestion, sore throat, headache, and cough. Started yesterday. Denies fever.

## 2019-11-03 LAB — NOVEL CORONAVIRUS, NAA (HOSP ORDER, SEND-OUT TO REF LAB; TAT 18-24 HRS): SARS-CoV-2, NAA: NOT DETECTED

## 2020-01-17 NOTE — Telephone Encounter (Signed)
Nexplanon provided for this patient. Nexplanon rcvd/charged 06/16/2019

## 2020-04-05 ENCOUNTER — Ambulatory Visit: Payer: Self-pay | Admitting: Family Medicine

## 2020-04-13 ENCOUNTER — Ambulatory Visit: Payer: Self-pay | Admitting: Family Medicine

## 2020-04-18 ENCOUNTER — Other Ambulatory Visit: Payer: Self-pay

## 2020-04-18 ENCOUNTER — Ambulatory Visit: Payer: 59 | Admitting: Family Medicine

## 2020-04-18 ENCOUNTER — Encounter: Payer: Self-pay | Admitting: Family Medicine

## 2020-04-18 VITALS — BP 130/90 | HR 76 | Ht 61.0 in | Wt 264.0 lb

## 2020-04-18 DIAGNOSIS — Z1211 Encounter for screening for malignant neoplasm of colon: Secondary | ICD-10-CM

## 2020-04-18 DIAGNOSIS — L508 Other urticaria: Secondary | ICD-10-CM | POA: Diagnosis not present

## 2020-04-18 DIAGNOSIS — Z7689 Persons encountering health services in other specified circumstances: Secondary | ICD-10-CM

## 2020-04-18 MED ORDER — FEXOFENADINE HCL 180 MG PO TABS: 180.0000 mg | ORAL_TABLET | Freq: Every day | ORAL | 3 refills | Status: DC

## 2020-04-18 NOTE — Progress Notes (Signed)
Date:  04/18/2020   Name:  Julia Gallegos   DOB:  May 09, 1966   MRN:  [id number]   Chief Complaint: Establish Care and Rash (breaks out in rash when gets hot, if she doesn't have allegra, itches as well)  Patient is a 54 year old female who presents for a comprehensive physical exam. The patient reports the following problems: urticaria. Health maintenance has been reviewed colonoscopy  Rash This is a chronic problem. The current episode started more than 1 year ago. The problem has been waxing and waning since onset. The rash is diffuse. The rash is characterized by redness and itchiness. Associated with: heat. Pertinent negatives include no anorexia, congestion, cough, diarrhea, eye pain, fatigue, fever, joint pain, nail changes, rhinorrhea, shortness of breath, sore throat or vomiting. Past treatments include antihistamine. The treatment provided significant relief.    No results found for: CREATININE, BUN, NA, K, CL, CO2 No results found for: CHOL, HDL, LDLCALC, LDLDIRECT, TRIG, CHOLHDL No results found for: TSH No results found for: HGBA1C No results found for: WBC, HGB, HCT, MCV, PLT No results found for: ALT, AST, GGT, ALKPHOS, BILITOT   Review of Systems  Constitutional: Negative.  Negative for chills, fatigue, fever and unexpected weight change.  HENT: Negative for congestion, ear discharge, ear pain, rhinorrhea, sinus pressure, sneezing and sore throat.   Eyes: Negative for photophobia, pain, discharge, redness and itching.  Respiratory: Negative for cough, shortness of breath, wheezing and stridor.   Gastrointestinal: Negative for abdominal pain, anorexia, blood in stool, constipation, diarrhea, nausea and vomiting.  Endocrine: Negative for cold intolerance, heat intolerance, polydipsia, polyphagia and polyuria.  Genitourinary: Negative for dysuria, flank pain, frequency, hematuria, menstrual problem, pelvic pain, urgency, vaginal bleeding and vaginal discharge.   Musculoskeletal: Negative for arthralgias, back pain, joint pain and myalgias.  Skin: Positive for rash. Negative for nail changes.  Allergic/Immunologic: Negative for environmental allergies and food allergies.  Neurological: Negative for dizziness, weakness, light-headedness, numbness and headaches.  Hematological: Negative for adenopathy. Does not bruise/bleed easily.  Psychiatric/Behavioral: Negative for dysphoric mood. The patient is not nervous/anxious.     There are no problems to display for this patient.   Allergies  Allergen Reactions  . Vicodin [Hydrocodone-Acetaminophen] Itching    Past Surgical History:  Procedure Laterality Date  . NO PAST SURGERIES      Social History   Tobacco Use  . Smoking status: Former Smoker    Quit date: 08/27/2016    Years since quitting: 3.6  . Smokeless tobacco: Never Used  Vaping Use  . Vaping Use: Never used  Substance Use Topics  . Alcohol use: Yes    Comment: occasionally  . Drug use: Never     Medication list has been reviewed and updated.  Current Meds  Medication Sig  . esomeprazole (NEXIUM) 40 MG capsule Take 40 mg by mouth daily at 12 noon.  . fexofenadine (ALLEGRA) 180 MG tablet Take 180 mg by mouth daily.    PHQ 2/9 Scores 04/18/2020  PHQ - 2 Score 0  PHQ- 9 Score 0    GAD 7 : Generalized Anxiety Score 04/18/2020  Nervous, Anxious, on Edge 0  Control/stop worrying 0  Worry too much - different things 0  Trouble relaxing 0  Restless 0  Easily annoyed or irritable 0  Afraid - awful might happen 0  Total GAD 7 Score 0    BP Readings from Last 3 Encounters:  04/18/20 130/90  06/16/19 (!) 147/92  06/26/18 Marland Kitchen)  145/83    Physical Exam Vitals and nursing note reviewed.  Constitutional:      Appearance: She is well-developed.  HENT:     Head: Normocephalic.     Right Ear: External ear normal.     Left Ear: External ear normal.  Eyes:     General: Lids are everted, no foreign bodies appreciated. No  scleral icterus.       Left eye: No foreign body or hordeolum.     Conjunctiva/sclera: Conjunctivae normal.     Right eye: Right conjunctiva is not injected.     Left eye: Left conjunctiva is not injected.     Pupils: Pupils are equal, round, and reactive to light.  Neck:     Thyroid: No thyromegaly.     Vascular: No JVD.     Trachea: No tracheal deviation.  Cardiovascular:     Rate and Rhythm: Normal rate and regular rhythm.     Heart sounds: Normal heart sounds. No murmur heard.  No friction rub. No gallop.   Pulmonary:     Effort: Pulmonary effort is normal. No respiratory distress.     Breath sounds: Normal breath sounds. No wheezing or rales.  Abdominal:     General: Bowel sounds are normal.     Palpations: Abdomen is soft. There is no mass.     Tenderness: There is no abdominal tenderness. There is no guarding or rebound.  Musculoskeletal:        General: No tenderness. Normal range of motion.     Cervical back: Normal range of motion and neck supple.  Lymphadenopathy:     Cervical: No cervical adenopathy.  Skin:    General: Skin is warm.     Findings: No rash.  Neurological:     Mental Status: She is alert and oriented to person, place, and time.     Cranial Nerves: No cranial nerve deficit.     Deep Tendon Reflexes: Reflexes normal.  Psychiatric:        Mood and Affect: Mood is not anxious or depressed.     Wt Readings from Last 3 Encounters:  04/18/20 264 lb (119.7 kg)  11/02/19 250 lb (113.4 kg)  06/16/19 248 lb (112.5 kg)    BP 130/90   Pulse 76   Ht 5\' 1"  (1.549 m)   Wt 264 lb (119.7 kg)   BMI 49.88 kg/m   Assessment and Plan:  1. Establishing care with new doctor, encounter for Patient presents for establishing care with new physician.  Patient's previous encounters were reviewed as well as most recent labs, most recent imaging, and care everywhere.  Health maintenance was discussed and immunizations/Covid was discussed as well.  2. Chronic  urticaria Patient has a history of chronic urticaria that seems to be heat or cold inducible.  It is controlled with the 24-hour Allegra.  This is been refilled for patient and information has been given on urticaria. - fexofenadine (ALLEGRA) 180 MG tablet; Take 1 tablet (180 mg total) by mouth daily.  Dispense: 90 tablet; Refill: 3  3. Colon cancer screening Discussed with patient and patient agrees with colonoscopy referral and [facility] was consulted. - Ambulatory referral to Gastroenterology

## 2020-04-18 NOTE — Patient Instructions (Signed)
Hives Hives (urticaria) are itchy, red, swollen areas on the skin. Hives can appear on any part of the body. Hives often fade within 24 hours (acute hives). Sometimes, new hives appear after old ones fade and the cycle can continue for several days or weeks (chronic hives). Hives do not spread from person to person (are not contagious). Hives come from the body's reaction to something a person is allergic to (allergen), something that causes irritation, or various other triggers. When a person is exposed to a trigger, his or her body releases a chemical (histamine) that causes redness, itching, and swelling. Hives can appear right after exposure to a trigger or hours later. What are the causes? This condition may be caused by:  Allergies to foods or ingredients.  Insect bites or stings.  Exposure to pollen or pets.  Contact with latex or chemicals.  Spending time in sunlight, heat, or cold (exposure).  Exercise.  Stress.  Certain medicines. You can also get hives from other medical conditions and treatments, such as:  Viruses, including the common cold.  Bacterial infections, such as urinary tract infections and strep throat.  Certain medicines.  Allergy shots.  Blood transfusions. Sometimes, the cause of this condition is not known (idiopathic hives). What increases the risk? You are more likely to develop this condition if you:  Are a woman.  Have food allergies, especially to citrus fruits, milk, eggs, peanuts, tree nuts, or shellfish.  Are allergic to: ? Medicines. ? Latex. ? Insects. ? Animals. ? Pollen. What are the signs or symptoms? Common symptoms of this condition include raised, itchy, red or white bumps or patches on your skin. These areas may:  Become large and swollen (welts).  Change in shape and location, quickly and repeatedly.  Be separate hives or connect over a large area of skin.  Sting or become painful.  Turn white when pressed in the  center (blanch). In severe cases, yourhands, feet, and face may also become swollen. This may occur if hives develop deeper in your skin. How is this diagnosed? This condition may be diagnosed by your symptoms, medical history, and physical exam.  Your skin, urine, or blood may be tested to find out what is causing your hives and to rule out other health issues.  Your health care provider may also remove a small sample of skin from the affected area and examine it under a microscope (biopsy). How is this treated? Treatment for this condition depends on the cause and severity of your symptoms. Your health care provider may recommend using cool, wet cloths (cool compresses) or taking cool showers to relieve itching. Treatment may include:  Medicines that help: ? Relieve itching (antihistamines). ? Reduce swelling (corticosteroids). ? Treat infection (antibiotics).  An injectable medicine (omalizumab). Your health care provider may prescribe this if you have chronic idiopathic hives and you continue to have symptoms even after treatment with antihistamines. Severe cases may require an emergency injection of adrenaline (epinephrine) to prevent a life-threatening allergic reaction (anaphylaxis). Follow these instructions at home: Medicines  Take and apply over-the-counter and prescription medicines only as told by your health care provider.  If you were prescribed an antibiotic medicine, take it as told by your health care provider. Do not stop using the antibiotic even if you start to feel better. Skin care  Apply cool compresses to the affected areas.  Do not scratch or rub your skin. General instructions  Do not take hot showers or baths. This can make itching   worse.  Do not wear tight-fitting clothing.  Use sunscreen and wear protective clothing when you are outside.  Avoid any substances that cause your hives. Keep a journal to help track what causes your hives. Write  down: ? What medicines you take. ? What you eat and drink. ? What products you use on your skin.  Keep all follow-up visits as told by your health care provider. This is important. Contact a health care provider if:  Your symptoms are not controlled with medicine.  Your joints are painful or swollen. Get help right away if:  You have a fever.  You have pain in your abdomen.  Your tongue or lips are swollen.  Your eyelids are swollen.  Your chest or throat feels tight.  You have trouble breathing or swallowing. These symptoms may represent a serious problem that is an emergency. Do not wait to see if the symptoms will go away. Get medical help right away. Call your local emergency services (911 in the [place] Do not drive yourself to the hospital. Summary  Hives (urticaria) are itchy, red, swollen areas on your skin. Hives come from the body's reaction to something a person is allergic to (allergen), something that causes irritation, or various other triggers.  Treatment for this condition depends on the cause and severity of your symptoms.  Avoid any substances that cause your hives. Keep a journal to help track what causes your hives.  Take and apply over-the-counter and prescription medicines only as told by your health care provider.  Keep all follow-up visits as told by your health care provider. This is important. This information is not intended to replace advice given to you by your health care provider. Make sure you discuss any questions you have with your health care provider. Document Revised: 04/29/2018 Document Reviewed: 04/29/2018 Elsevier Patient Education  2020 [employer]  

## 2020-04-25 ENCOUNTER — Other Ambulatory Visit: Payer: Self-pay

## 2020-04-25 ENCOUNTER — Telehealth (INDEPENDENT_AMBULATORY_CARE_PROVIDER_SITE_OTHER): Payer: Self-pay | Admitting: Gastroenterology

## 2020-04-25 DIAGNOSIS — Z1211 Encounter for screening for malignant neoplasm of colon: Secondary | ICD-10-CM

## 2020-04-25 MED ORDER — PEG 3350-KCL-NA BICARB-NACL 420 G PO SOLR: 4000.0000 mL | Freq: Once | ORAL | 0 refills | Status: AC

## 2020-04-25 NOTE — Progress Notes (Signed)
Gastroenterology Pre-Procedure Review  Request Date: 06/09/20 Requesting Physician: Dr. Bonna Gains  PATIENT REVIEW QUESTIONS: The patient responded to the following health history questions as indicated:    1. Are you having any GI issues? no 2. Do you have a personal history of Polyps? no 3. Do you have a family history of Colon Cancer or Polyps? no 4. Diabetes Mellitus? no 5. Joint replacements in the past 12 months?no 6. Major health problems in the past 3 months?no 7. Any artificial heart valves, MVP, or defibrillator?no    MEDICATIONS & ALLERGIES:    Patient reports the following regarding taking any anticoagulation/antiplatelet therapy:   Plavix, Coumadin, Eliquis, Xarelto, Lovenox, Pradaxa, Brilinta, or Effient? no Aspirin? no  Patient confirms/reports the following medications:  Current Outpatient Medications  Medication Sig Dispense Refill   esomeprazole (NEXIUM) 40 MG capsule Take 40 mg by mouth daily at 12 noon.     fexofenadine (ALLEGRA) 180 MG tablet Take 1 tablet (180 mg total) by mouth daily. 90 tablet 3   etonogestrel (NEXPLANON) 68 MG IMPL implant 1 each (68 mg total) by Subdermal route once for 1 dose. 1 each 0   ibuprofen (ADVIL) 200 MG tablet Take by mouth.     No current facility-administered medications for this visit.    Patient confirms/reports the following allergies:  Allergies  Allergen Reactions   Naproxen Nausea Only    Other reaction(s): Dizziness   Skin Adhesives [Cyanoacrylate] Other (See Comments)    Bandaids cause skin to blister   Vicodin [Hydrocodone-Acetaminophen] Itching    No orders of the defined types were placed in this encounter.   AUTHORIZATION INFORMATION Primary Insurance: 1D#: Group #:  Secondary Insurance: 1D#: Group #:  SCHEDULE INFORMATION: Date: Friday 06/09/20 Time: Location:[facility]

## 2020-06-07 ENCOUNTER — Other Ambulatory Visit: Payer: Self-pay

## 2020-06-07 ENCOUNTER — Other Ambulatory Visit
Admission: RE | Admit: 2020-06-07 | Discharge: 2020-06-07 | Disposition: A | Discharge status: Home / Self Care | Payer: 59 | Source: Ambulatory Visit | Attending: Gastroenterology | Admitting: Gastroenterology

## 2020-06-07 DIAGNOSIS — Z01812 Encounter for preprocedural laboratory examination: Secondary | ICD-10-CM | POA: Insufficient documentation

## 2020-06-07 DIAGNOSIS — Z20822 Contact with and (suspected) exposure to covid-19: Secondary | ICD-10-CM | POA: Insufficient documentation

## 2020-06-07 LAB — SARS CORONAVIRUS 2 (TAT 6-24 HRS): SARS Coronavirus 2: NEGATIVE

## 2020-06-09 ENCOUNTER — Ambulatory Visit: Payer: 59 | Admitting: Anesthesiology

## 2020-06-09 ENCOUNTER — Encounter
Admission: RE | Disposition: A | Discharge status: Home / Self Care | Payer: Self-pay | Source: Home / Self Care | Attending: Gastroenterology

## 2020-06-09 ENCOUNTER — Other Ambulatory Visit: Payer: Self-pay

## 2020-06-09 ENCOUNTER — Ambulatory Visit
Admission: RE | Admit: 2020-06-09 | Discharge: 2020-06-09 | Disposition: A | Discharge status: Home / Self Care | Payer: 59 | Attending: Gastroenterology | Admitting: Gastroenterology

## 2020-06-09 ENCOUNTER — Encounter: Payer: Self-pay | Admitting: Gastroenterology

## 2020-06-09 DIAGNOSIS — Z87891 Personal history of nicotine dependence: Secondary | ICD-10-CM | POA: Insufficient documentation

## 2020-06-09 DIAGNOSIS — K648 Other hemorrhoids: Secondary | ICD-10-CM | POA: Diagnosis not present

## 2020-06-09 DIAGNOSIS — K635 Polyp of colon: Secondary | ICD-10-CM | POA: Diagnosis not present

## 2020-06-09 DIAGNOSIS — K219 Gastro-esophageal reflux disease without esophagitis: Secondary | ICD-10-CM | POA: Diagnosis not present

## 2020-06-09 DIAGNOSIS — K5730 Diverticulosis of large intestine without perforation or abscess without bleeding: Secondary | ICD-10-CM | POA: Diagnosis not present

## 2020-06-09 DIAGNOSIS — Z793 Long term (current) use of hormonal contraceptives: Secondary | ICD-10-CM | POA: Insufficient documentation

## 2020-06-09 DIAGNOSIS — D124 Benign neoplasm of descending colon: Secondary | ICD-10-CM | POA: Insufficient documentation

## 2020-06-09 DIAGNOSIS — M1990 Unspecified osteoarthritis, unspecified site: Secondary | ICD-10-CM | POA: Insufficient documentation

## 2020-06-09 DIAGNOSIS — Z1211 Encounter for screening for malignant neoplasm of colon: Secondary | ICD-10-CM | POA: Diagnosis not present

## 2020-06-09 DIAGNOSIS — Z791 Long term (current) use of non-steroidal anti-inflammatories (NSAID): Secondary | ICD-10-CM | POA: Diagnosis not present

## 2020-06-09 DIAGNOSIS — Z886 Allergy status to analgesic agent status: Secondary | ICD-10-CM | POA: Diagnosis not present

## 2020-06-09 DIAGNOSIS — D125 Benign neoplasm of sigmoid colon: Secondary | ICD-10-CM | POA: Diagnosis not present

## 2020-06-09 DIAGNOSIS — K5790 Diverticulosis of intestine, part unspecified, without perforation or abscess without bleeding: Secondary | ICD-10-CM | POA: Diagnosis not present

## 2020-06-09 HISTORY — PX: COLONOSCOPY WITH PROPOFOL: SHX5780

## 2020-06-09 SURGERY — COLONOSCOPY WITH PROPOFOL
Anesthesia: General

## 2020-06-09 MED ORDER — LIDOCAINE HCL (CARDIAC) PF 100 MG/5ML IV SOSY
PREFILLED_SYRINGE | INTRAVENOUS | Status: DC | PRN
  Administered 2020-06-09: 50 mg via INTRAVENOUS

## 2020-06-09 MED ORDER — PROPOFOL 500 MG/50ML IV EMUL
INTRAVENOUS | Status: AC
  Filled 2020-06-09: qty 50

## 2020-06-09 MED ORDER — PROPOFOL 500 MG/50ML IV EMUL
INTRAVENOUS | Status: DC | PRN
  Administered 2020-06-09: 175 ug/kg/min via INTRAVENOUS

## 2020-06-09 MED ORDER — PHENYLEPHRINE HCL (PRESSORS) 10 MG/ML IV SOLN
INTRAVENOUS | Status: DC | PRN
  Administered 2020-06-09: 100 ug via INTRAVENOUS

## 2020-06-09 MED ORDER — PROPOFOL 10 MG/ML IV BOLUS
INTRAVENOUS | Status: DC | PRN
  Administered 2020-06-09: 60 mg via INTRAVENOUS
  Administered 2020-06-09: 40 mg via INTRAVENOUS

## 2020-06-09 MED ORDER — SODIUM CHLORIDE 0.9 % IV SOLN: INTRAVENOUS | Status: DC

## 2020-06-09 NOTE — Anesthesia Preprocedure Evaluation (Signed)
Anesthesia Evaluation  Patient identified by MRN, date of birth, ID band Patient awake    Reviewed: Allergy & Precautions, NPO status , Patient's Chart, lab work & pertinent test results  History of Anesthesia Complications Negative for: history of anesthetic complications  Airway Mallampati: II  TM Distance: >3 FB Neck ROM: Full    Dental  (+) Edentulous Upper   Pulmonary neg sleep apnea, neg COPD, former smoker,    breath sounds clear to auscultation- rhonchi (-) wheezing      Cardiovascular (-) hypertension(-) CAD, (-) Past MI, (-) Cardiac Stents and (-) CABG  Rhythm:Regular Rate:Normal - Systolic murmurs and - Diastolic murmurs    Neuro/Psych neg Seizures negative neurological ROS  negative psych ROS   GI/Hepatic Neg liver ROS, GERD  ,  Endo/Other  negative endocrine ROSneg diabetes  Renal/GU negative Renal ROS     Musculoskeletal  (+) Arthritis ,   Abdominal (+) + obese,   Peds  Hematology negative hematology ROS (+)   Anesthesia Other Findings Past Medical History: No date: Allergy No date: GERD (gastroesophageal reflux disease) No date: Osteoarthritis   Reproductive/Obstetrics                             Anesthesia Physical Anesthesia Plan  ASA: III  Anesthesia Plan: General   Post-op Pain Management:    Induction: Intravenous  PONV Risk Score and Plan: 2 and Propofol infusion  Airway Management Planned: Natural Airway  Additional Equipment:   Intra-op Plan:   Post-operative Plan:   Informed Consent: I have reviewed the patients History and Physical, chart, labs and discussed the procedure including the risks, benefits and alternatives for the proposed anesthesia with the patient or authorized representative who has indicated his/her understanding and acceptance.     Dental advisory given  Plan Discussed with: CRNA and Anesthesiologist  Anesthesia Plan  Comments:         Anesthesia Quick Evaluation

## 2020-06-09 NOTE — Op Note (Signed)
[facility] Gastroenterology Patient Name: Julia Gallegos Procedure Date: 06/09/2020 9:38 AM MRN: [id number] Account #: [id number] Date of Birth: 1966-01-30 Admit Type: Outpatient Age: 54 Room: [facility] ENDO ROOM 4 Gender: Female Note Status: Finalized Procedure:             Colonoscopy Indications:           Screening for colorectal malignant neoplasm Providers:             Gladies Sofranko B. Bonna Gains MD, MD Medicines:             Monitored Anesthesia Care Complications:         No immediate complications. Procedure:             Pre-Anesthesia Assessment:                        - ASA Grade Assessment: II - A patient with mild                         systemic disease.                        - Prior to the procedure, a History and Physical was                         performed, and patient medications, allergies and                         sensitivities were reviewed. The patient's tolerance                         of previous anesthesia was reviewed.                        - The risks and benefits of the procedure and the                         sedation options and risks were discussed with the                         patient. All questions were answered and informed                         consent was obtained.                        - Patient identification and proposed procedure were                         verified prior to the procedure by the physician, the                         nurse, the anesthesiologist, the anesthetist and the                         technician. The procedure was verified in the                         procedure room.  After obtaining informed consent, the colonoscope was                         passed under direct vision. Throughout the procedure,                         the patient's blood pressure, pulse, and oxygen                         saturations were monitored continuously. The                         Colonoscope was  introduced through the anus and                         advanced to the the cecum, identified by appendiceal                         orifice and ileocecal valve. The colonoscopy was                         performed with ease. The patient tolerated the                         procedure well. The quality of the bowel preparation                         was fair. Findings:      The perianal and digital rectal examinations were normal.      A 6 mm polyp was found in the descending colon. The polyp was sessile.       The polyp was removed with a cold snare. Resection and retrieval were       complete.      A 3 to 4 mm polyp was found in the sigmoid colon. The polyp was flat.       The polyp was removed with a jumbo cold forceps. Resection and retrieval       were complete.      Multiple diverticula were found in the sigmoid colon.      The exam was otherwise without abnormality.      The rectum, sigmoid colon, descending colon, transverse colon, ascending       colon and cecum appeared normal.      Non-bleeding internal hemorrhoids were found during retroflexion. Impression:            - Preparation of the colon was fair.                        - One 6 mm polyp in the descending colon, removed with                         a cold snare. Resected and retrieved.                        - One 3 to 4 mm polyp in the sigmoid colon, removed                         with a jumbo cold forceps. Resected and retrieved.                        -  Diverticulosis in the sigmoid colon.                        - The examination was otherwise normal.                        - The rectum, sigmoid colon, descending colon,                         transverse colon, ascending colon and cecum are normal.                        - Non-bleeding internal hemorrhoids. Recommendation:        - Discharge patient to home (with escort).                        - Advance diet as tolerated.                        - Continue  present medications.                        - Await pathology results.                        - Repeat colonoscopy in 3 years, with 2 day prep.                        - The findings and recommendations were discussed with                         the patient.                        - The findings and recommendations were discussed with                         the patient's family.                        - Return to primary care physician as previously                         scheduled.                        - High fiber diet. Procedure Code(s):     --- Professional ---                        [id number], Colonoscopy, flexible; with removal of                         tumor(s), polyp(s), or other lesion(s) by snare                         technique                        44967, 59, Colonoscopy, flexible; with biopsy, single  or multiple Diagnosis Code(s):     --- Professional ---                        Z12.11, Encounter for screening for malignant neoplasm                         of colon                        K63.5, Polyp of colon CPT copyright 2019 American Medical Association. All rights reserved. The codes documented in this report are preliminary and upon coder review may  be revised to meet current compliance requirements.  Vonda Antigua, MD Margretta Sidle B. Bonna Gains MD, MD 06/09/2020 10:30:11 AM This report has been signed electronically. Number of Addenda: 0 Note Initiated On: 06/09/2020 9:38 AM Scope Withdrawal Time: 0 hours 21 minutes 2 seconds  Total Procedure Duration: 0 hours 26 minutes 5 seconds  Estimated Blood Loss:  Estimated blood loss: none.      [facility]

## 2020-06-09 NOTE — H&P (Signed)
Julia Antigua, MD [address] [place], [place], [place], [address] [address], [place], [place], [place], [address] Phone: [phone]  Fax: [phone]  Primary Care Physician:  Juline Patch, MD   Pre-Procedure History & Physical: HPI:  Julia Gallegos is a 54 y.o. female is here for a colonoscopy.   Past Medical History:  Diagnosis Date  . Allergy   . GERD (gastroesophageal reflux disease)   . Osteoarthritis     Past Surgical History:  Procedure Laterality Date  . NO PAST SURGERIES      Prior to Admission medications   Medication Sig Start Date End Date Taking? Authorizing Provider  esomeprazole (NEXIUM) 40 MG capsule Take 40 mg by mouth daily at 12 noon.   Yes [provider]  fexofenadine (ALLEGRA) 180 MG tablet Take 1 tablet (180 mg total) by mouth daily. 04/18/20  Yes Juline Patch, MD  ibuprofen (ADVIL) 200 MG tablet Take by mouth.   Yes [provider]  etonogestrel (NEXPLANON) 68 MG IMPL implant 1 each (68 mg total) by Subdermal route once for 1 dose. 06/16/19 11/02/19  Will Bonnet, MD    Allergies as of 04/25/2020 - Review Complete 04/25/2020  Allergen Reaction Noted  . Naproxen Nausea Only 05/26/2018  . Skin adhesives [cyanoacrylate] Other (See Comments) 04/25/2020  . Vicodin [hydrocodone-acetaminophen] Itching 05/12/2018    Family History  Problem Relation Age of Onset  . Pneumonia Mother 17  . COPD Father   . Heart disease Maternal Grandmother   . Cancer Maternal Grandfather   . Heart disease Paternal Grandmother     Social History   Socioeconomic History  . Marital status: Divorced    Spouse name: Not on file  . Number of children: Not on file  . Years of education: Not on file  . Highest education level: Not on file  Occupational History  . Not on file  Tobacco Use  . Smoking status: Former Smoker    Quit date: 08/27/2016    Years since quitting: 3.7  . Smokeless tobacco: Never Used  Vaping Use  .  Vaping Use: Never used  Substance and Sexual Activity  . Alcohol use: Yes    Comment: occasionally  . Drug use: Never  . Sexual activity: Yes    Birth control/protection: Implant  Other Topics Concern  . Not on file  Social History Narrative  . Not on file   Social Determinants of Health   Financial Resource Strain:   . Difficulty of Paying Living Expenses:   Food Insecurity:   . Worried About Charity fundraiser in the Last Year:   . Arboriculturist in the Last Year:   Transportation Needs:   . Film/video editor (Medical):   Marland Kitchen Lack of Transportation (Non-Medical):   Physical Activity:   . Days of Exercise per Week:   . Minutes of Exercise per Session:   Stress:   . Feeling of Stress :   Social Connections:   . Frequency of Communication with Friends and Family:   . Frequency of Social Gatherings with Friends and Family:   . Attends Religious Services:   . Active Member of Clubs or Organizations:   . Attends Archivist Meetings:   Marland Kitchen Marital Status:   Intimate Partner Violence:   . Fear of Current or Ex-Partner:   . Emotionally Abused:   Marland Kitchen Physically Abused:   . Sexually Abused:     Review of Systems: See HPI,  otherwise negative ROS  Physical Exam: BP (!) 166/104   Pulse (!) 102   Temp (!) 97.2 F (36.2 C) (Temporal)   Resp 20   Ht 5\' 2"  (1.575 m)   Wt 115.2 kg   SpO2 98%   BMI 46.46 kg/m  General:   Alert,  pleasant and cooperative in NAD Head:  Normocephalic and atraumatic. Neck:  Supple; no masses or thyromegaly. Lungs:  Clear throughout to auscultation, normal respiratory effort.    Heart:  +S1, +S2, Regular rate and rhythm, No edema. Abdomen:  Soft, nontender and nondistended. Normal bowel sounds, without guarding, and without rebound.   Neurologic:  Alert and  oriented x4;  grossly normal neurologically.  Impression/Plan: Julia Gallegos is here for a colonoscopy to be performed for average risk screening.  Risks, benefits,  limitations, and alternatives regarding  colonoscopy have been reviewed with the patient.  Questions have been answered.  All parties agreeable.   Virgel Manifold, MD  06/09/2020, 9:37 AM

## 2020-06-09 NOTE — Anesthesia Procedure Notes (Signed)
Date/Time: 06/09/2020 9:51 AM Performed by: Johnna Acosta, CRNA Pre-anesthesia Checklist: Patient identified, Emergency Drugs available, Suction available, Patient being monitored and Timeout performed Patient Re-evaluated:Patient Re-evaluated prior to induction Oxygen Delivery Method: Nasal cannula Preoxygenation: Pre-oxygenation with 100% oxygen Induction Type: IV induction

## 2020-06-09 NOTE — Transfer of Care (Signed)
Immediate Anesthesia Transfer of Care Note  Patient: Julia Gallegos  Procedure(s) Performed: COLONOSCOPY WITH PROPOFOL (N/A )  Patient Location: PACU  Anesthesia Type:General  Level of Consciousness: awake and alert   Airway & Oxygen Therapy: Patient Spontanous Breathing  Post-op Assessment: Report given to RN and Post -op Vital signs reviewed and stable  Post vital signs: Reviewed and stable  Last Vitals:  Vitals Value Taken Time  BP 114/64 06/09/20 1025  Temp 36.7 C 06/09/20 1025  Pulse 68 06/09/20 1027  Resp 19 06/09/20 1027  SpO2 95 % 06/09/20 1027    Last Pain:  Vitals:   06/09/20 1025  TempSrc: Temporal  PainSc:          Complications: No complications documented.

## 2020-06-09 NOTE — Anesthesia Postprocedure Evaluation (Signed)
Anesthesia Post Note  Patient: Julia Gallegos  Procedure(s) Performed: COLONOSCOPY WITH PROPOFOL (N/A )  Patient location during evaluation: Endoscopy Anesthesia Type: General Level of consciousness: awake and alert and oriented Pain management: pain level controlled Vital Signs Assessment: post-procedure vital signs reviewed and stable Respiratory status: spontaneous breathing, nonlabored ventilation and respiratory function stable Cardiovascular status: blood pressure returned to baseline and stable Postop Assessment: no signs of nausea or vomiting Anesthetic complications: no   No complications documented.   Last Vitals:  Vitals:   06/09/20 1045 06/09/20 1055  BP: 110/63 112/62  Pulse: 65 64  Resp: 19 20  Temp:    SpO2: 97% 100%    Last Pain:  Vitals:   06/09/20 1055  TempSrc:   PainSc: 0-No pain                 [employer]

## 2020-06-12 ENCOUNTER — Encounter: Payer: Self-pay | Admitting: Gastroenterology

## 2020-06-12 LAB — SURGICAL PATHOLOGY

## 2020-12-13 ENCOUNTER — Ambulatory Visit
Admission: EM | Admit: 2020-12-13 | Discharge: 2020-12-13 | Disposition: A | Discharge status: Home / Self Care | Payer: 59 | Attending: Emergency Medicine | Admitting: Emergency Medicine

## 2020-12-13 ENCOUNTER — Other Ambulatory Visit: Payer: Self-pay

## 2020-12-13 ENCOUNTER — Encounter: Payer: Self-pay | Admitting: Emergency Medicine

## 2020-12-13 DIAGNOSIS — Z886 Allergy status to analgesic agent status: Secondary | ICD-10-CM | POA: Insufficient documentation

## 2020-12-13 DIAGNOSIS — U071 COVID-19: Secondary | ICD-10-CM | POA: Diagnosis not present

## 2020-12-13 DIAGNOSIS — Z87891 Personal history of nicotine dependence: Secondary | ICD-10-CM | POA: Diagnosis not present

## 2020-12-13 DIAGNOSIS — Z885 Allergy status to narcotic agent status: Secondary | ICD-10-CM | POA: Insufficient documentation

## 2020-12-13 DIAGNOSIS — Z79899 Other long term (current) drug therapy: Secondary | ICD-10-CM | POA: Diagnosis not present

## 2020-12-13 DIAGNOSIS — J069 Acute upper respiratory infection, unspecified: Secondary | ICD-10-CM | POA: Diagnosis not present

## 2020-12-13 DIAGNOSIS — R0981 Nasal congestion: Secondary | ICD-10-CM | POA: Diagnosis present

## 2020-12-13 LAB — SARS CORONAVIRUS 2 (TAT 6-24 HRS): SARS Coronavirus 2: POSITIVE — AB

## 2020-12-13 MED ORDER — BENZONATATE 100 MG PO CAPS
200.0000 mg | ORAL_CAPSULE | Freq: Three times a day (TID) | ORAL | 0 refills | Status: DC
Start: 2020-12-13 — End: 2021-02-28

## 2020-12-13 MED ORDER — PROMETHAZINE-DM 6.25-15 MG/5ML PO SYRP
5.0000 mL | ORAL_SOLUTION | Freq: Four times a day (QID) | ORAL | 0 refills | Status: DC | PRN
Start: 2020-12-13 — End: 2021-02-28

## 2020-12-13 NOTE — ED Triage Notes (Signed)
Patient in today c/o nasal congestion and cough x 3 days and fever (100.3) last night. Patient states she has had nasal congestion since 11/06/20 and resolved for about 4 days and now has returned. Patient has been taking OTC Robitussin DM, Tylenol, Sinex spray and Ibuprofen. Patient has had the covid vaccines, no booster.

## 2020-12-13 NOTE — ED Provider Notes (Signed)
[facility]    CSN: [id number] Arrival date & time: 12/13/20  0800      History   Chief Complaint Chief Complaint  Patient presents with  . sinus congestion  . Cough  . Fever    HPI Julia Gallegos is a 55 y.o. female.   HPI   55 year old female here for evaluation of nasal congestion and cough that started 3 days ago.  Patient reports that she has been dealing with the symptoms off and on since 10 January but she became concerned when she developed a fever last night. T-max was 100.3 and has resolved this morning. Patient states that she has had an associated runny nose for clear mucus, sore throat that is more scratchy in nature and her cough is intermittently productive for clear sputum. Patient denies shortness of breath or wheezing ear pain or pressure, GI complaints, body aches, or known Covid exposure. She has had her COVID vaccine but has not had her booster shot and she has had her flu shot.  Past Medical History:  Diagnosis Date  . Allergy   . GERD (gastroesophageal reflux disease)   . Osteoarthritis     Patient Active Problem List   Diagnosis Date Noted  . Encounter for screening colonoscopy   . Polyp of colon     Past Surgical History:  Procedure Laterality Date  . COLONOSCOPY WITH PROPOFOL N/A 06/09/2020   Procedure: COLONOSCOPY WITH PROPOFOL;  Surgeon: Virgel Manifold, MD;  Location: [facility] ENDOSCOPY;  Service: Endoscopy;  Laterality: N/A;  . NO PAST SURGERIES      OB History    Gravida  2   Para  2   Term      Preterm      AB      Living  2     SAB      IAB      Ectopic      Multiple      Live Births               Home Medications    Prior to Admission medications   Medication Sig Start Date End Date Taking? Authorizing Provider  benzonatate (TESSALON) 100 MG capsule Take 2 capsules (200 mg total) by mouth every 8 (eight) hours. 12/13/20  Yes Margarette Canada, NP  esomeprazole (NEXIUM) 40 MG capsule Take 40 mg by  mouth daily at 12 noon.   Yes [provider]  etonogestrel (NEXPLANON) 68 MG IMPL implant 1 each (68 mg total) by Subdermal route once for 1 dose. 06/16/19 11/02/19 Yes Will Bonnet, MD  fexofenadine (ALLEGRA) 180 MG tablet Take 1 tablet (180 mg total) by mouth daily. 04/18/20  Yes Juline Patch, MD  ibuprofen (ADVIL) 200 MG tablet Take by mouth.   Yes [provider]  promethazine-dextromethorphan (PROMETHAZINE-DM) 6.25-15 MG/5ML syrup Take 5 mLs by mouth 4 (four) times daily as needed. 12/13/20  Yes Margarette Canada, NP    Family History Family History  Problem Relation Age of Onset  . Pneumonia Mother 63  . COPD Father   . Heart disease Maternal Grandmother   . Cancer Maternal Grandfather   . Heart disease Paternal Grandmother     Social History Social History   Tobacco Use  . Smoking status: Former Smoker    Quit date: 08/27/2016    Years since quitting: 4.2  . Smokeless tobacco: Never Used  Vaping Use  . Vaping Use: Never used  Substance Use Topics  .  Alcohol use: Yes    Comment: occasionally  . Drug use: Never     Allergies   Naproxen, Skin adhesives [cyanoacrylate], and Vicodin [hydrocodone-acetaminophen]   Review of Systems Review of Systems  Constitutional: Negative for activity change, appetite change and fever.  HENT: Positive for congestion, rhinorrhea and sore throat. Negative for ear pain.   Respiratory: Positive for cough. Negative for shortness of breath and wheezing.   Gastrointestinal: Negative for diarrhea, nausea and vomiting.  Musculoskeletal: Negative for arthralgias and myalgias.  Skin: Negative for rash.  Hematological: Negative.   Psychiatric/Behavioral: Negative.      Physical Exam Triage Vital Signs ED Triage Vitals  Enc Vitals Group     BP      Pulse      Resp      Temp      Temp src      SpO2      Weight      Height      Head Circumference      Peak Flow      Pain Score      Pain Loc      Pain Edu?       Excl. in [place]?    No data found.  Updated Vital Signs BP (!) 155/86 (BP Location: Left Arm)   Pulse (!) 110   Temp 98.6 F (37 C) (Oral)   Resp 18   Ht 5' (1.524 m)   Wt 250 lb (113.4 kg)   SpO2 99%   BMI 48.82 kg/m   Visual Acuity Right Eye Distance:   Left Eye Distance:   Bilateral Distance:    Right Eye Near:   Left Eye Near:    Bilateral Near:     Physical Exam Vitals and nursing note reviewed.  Constitutional:      General: She is not in acute distress.    Appearance: Normal appearance. She is not ill-appearing.  HENT:     Head: Normocephalic and atraumatic.     Right Ear: Tympanic membrane, ear canal and external ear normal.     Left Ear: Tympanic membrane, ear canal and external ear normal.     Nose: Congestion and rhinorrhea present.     Mouth/Throat:     Mouth: Mucous membranes are moist.     Pharynx: Oropharynx is clear. Posterior oropharyngeal erythema present.  Cardiovascular:     Rate and Rhythm: Normal rate and regular rhythm.     Pulses: Normal pulses.     Heart sounds: Normal heart sounds. No murmur heard. No gallop.   Pulmonary:     Effort: Pulmonary effort is normal.     Breath sounds: Normal breath sounds. No wheezing, rhonchi or rales.  Musculoskeletal:     Cervical back: Normal range of motion and neck supple.  Lymphadenopathy:     Cervical: No cervical adenopathy.  Skin:    General: Skin is warm and dry.     Capillary Refill: Capillary refill takes less than 2 seconds.     Findings: No erythema or rash.  Neurological:     General: No focal deficit present.     Mental Status: She is alert and oriented to person, place, and time.  Psychiatric:        Mood and Affect: Mood normal.        Behavior: Behavior normal.        Thought Content: Thought content normal.        Judgment: Judgment normal.  UC Treatments / Results  Labs (all labs ordered are listed, but only abnormal results are displayed) Labs Reviewed  SARS CORONAVIRUS  2 (TAT 6-24 HRS)    EKG   Radiology No results found.  Procedures Procedures (including critical care time)  Medications Ordered in UC Medications - No data to display  Initial Impression / Assessment and Plan / UC Course  I have reviewed the triage vital signs and the nursing notes.  Pertinent labs & imaging results that were available during my care of the patient were reviewed by me and considered in my medical decision making (see chart for details).   Patient is a very pleasant, nontoxic-appearing 55 year old female here for evaluation of nasal congestion and cough that been going on for 3 days. Patient does not have any known Covid exposure and she is vaccinated but not boosted. Patient states that she became concerned when she developed a fever last night with a T-max of 100.3. The fever is gone today. Physical exam reveals erythematous and edematous nasal mucosa with clear nasal discharge. Posterior oropharynx is mildly erythematous with clear postnasal drip but tonsillar pillars are unremarkable. No cervical lymphadenopathy appreciated on exam and lungs are clear to auscultation all fields. Patient's edematous nasal mucosa may be secondary to her use of oxymetazoline nasal spray. She has been using it longer than the recommended 3 days. Will swab patient for COVID and have her isolate pending the results. Will cover with Tessalon Perles and Promethazine DM cough syrup for cough and congestion and also suggested patient perform sinus irrigation with a [employer]Med sinus rinse kit.   Final Clinical Impressions(s) / UC Diagnoses   Final diagnoses:  Viral URI with cough     Discharge Instructions     Isolate at home pending the results of your COVID test.  If you test positive then you will have to quarantine for 5 days from the start of your symptoms.  After 5 days you can break quarantine if your symptoms have improved and you have not had a fever for 24 hours without taking Tylenol  or ibuprofen.  Use over-the-counter Tylenol and ibuprofen as needed for body aches and fever.  Use the Tessalon Perles during the day as needed for cough and the Promethazine DM cough syrup at nighttime as will make you drowsy.  Use a [employer]Med sinus rinse kit and distilled water to irrigate your sinuses twice daily. Do not use tap water. You may heat the distilled water up in a sauce pan, do not use the microwave as it can cause super heated spots, and tested to the back of your wrist prior to instilling it up your nose. The water should be warm on your wrist and it should not sting.  If you develop any increased shortness of breath-especially at rest, you are unable to speak in full sentences, or is a late sign your lips are turning blue you need to go the ER for evaluation.     ED Prescriptions    Medication Sig Dispense Auth. Provider   benzonatate (TESSALON) 100 MG capsule Take 2 capsules (200 mg total) by mouth every 8 (eight) hours. 21 capsule Margarette Canada, NP   promethazine-dextromethorphan (PROMETHAZINE-DM) 6.25-15 MG/5ML syrup Take 5 mLs by mouth 4 (four) times daily as needed. 118 mL Margarette Canada, NP     PDMP not reviewed this encounter.   Margarette Canada, NP 12/13/20 [phone]

## 2020-12-13 NOTE — Discharge Instructions (Addendum)
Isolate at home pending the results of your COVID test.  If you test positive then you will have to quarantine for 5 days from the start of your symptoms.  After 5 days you can break quarantine if your symptoms have improved and you have not had a fever for 24 hours without taking Tylenol or ibuprofen.  Use over-the-counter Tylenol and ibuprofen as needed for body aches and fever.  Use the Tessalon Perles during the day as needed for cough and the Promethazine DM cough syrup at nighttime as will make you drowsy.  Use a NeilMed sinus rinse kit and distilled water to irrigate your sinuses twice daily. Do not use tap water. You may heat the distilled water up in a sauce pan, do not use the microwave as it can cause super heated spots, and tested to the back of your wrist prior to instilling it up your nose. The water should be warm on your wrist and it should not sting.  If you develop any increased shortness of breath-especially at rest, you are unable to speak in full sentences, or is a late sign your lips are turning blue you need to go the ER for evaluation.

## 2020-12-14 ENCOUNTER — Telehealth: Payer: Self-pay | Admitting: Family

## 2020-12-14 NOTE — Telephone Encounter (Signed)
Called to discuss with patient about COVID-19 symptoms and the use of one of the available treatments for those with mild to moderate Covid symptoms and at a high risk of hospitalization.  Pt appears to qualify for outpatient treatment due to co-morbid conditions and/or a member of an at-risk group in accordance with the FDA Emergency Use Authorization.    Symptom onset: 12/11/20 Vaccinated: Yes Booster? No Immunocompromised? No Qualifiers: Obesity with BMI 48  I spoke with Julia Gallegos who is not interested in receiving treatment. Call back number provided.   Terri Piedra, NP 12/14/2020 2:59 PM

## 2021-01-27 ENCOUNTER — Other Ambulatory Visit (HOSPITAL_COMMUNITY)
Admission: RE | Admit: 2021-01-27 | Discharge: 2021-01-27 | Disposition: A | Discharge status: Home / Self Care | Payer: 59 | Source: Ambulatory Visit | Attending: Obstetrics and Gynecology | Admitting: Obstetrics and Gynecology

## 2021-01-27 DIAGNOSIS — Z124 Encounter for screening for malignant neoplasm of cervix: Secondary | ICD-10-CM | POA: Insufficient documentation

## 2021-01-27 DIAGNOSIS — Z01419 Encounter for gynecological examination (general) (routine) without abnormal findings: Secondary | ICD-10-CM | POA: Insufficient documentation

## 2021-02-15 ENCOUNTER — Ambulatory Visit (INDEPENDENT_AMBULATORY_CARE_PROVIDER_SITE_OTHER): Payer: 59 | Admitting: Obstetrics and Gynecology

## 2021-02-15 ENCOUNTER — Other Ambulatory Visit: Payer: Self-pay

## 2021-02-15 ENCOUNTER — Encounter: Payer: Self-pay | Admitting: Obstetrics and Gynecology

## 2021-02-15 VITALS — BP 121/80 | Ht 60.0 in | Wt 249.0 lb

## 2021-02-15 DIAGNOSIS — Z1339 Encounter for screening examination for other mental health and behavioral disorders: Secondary | ICD-10-CM | POA: Diagnosis not present

## 2021-02-15 DIAGNOSIS — Z124 Encounter for screening for malignant neoplasm of cervix: Secondary | ICD-10-CM | POA: Diagnosis not present

## 2021-02-15 DIAGNOSIS — Z01419 Encounter for gynecological examination (general) (routine) without abnormal findings: Secondary | ICD-10-CM | POA: Diagnosis not present

## 2021-02-15 DIAGNOSIS — Z1331 Encounter for screening for depression: Secondary | ICD-10-CM

## 2021-02-15 NOTE — Progress Notes (Signed)
Routine Annual Gynecology Examination   PCP: Juline Patch, MD  Chief Complaint  Patient presents with  . Annual Exam    History of Present Illness: Patient is a 55 y.o. G2P2 presents for annual exam. The patient has no complaints today.   Menopausal bleeding: Has not had a period since she started Nexplanon 5 years ago (had one placed by me in 2020). She will be due to have her Nexplanon removed next year.  Menopausal symptoms: notes occasional mild hot flashes.   Breast symptoms: denies  Last pap smear: 6 years ago.  Result Normal  Last mammogram: never had  Colorectal cancer screen: 2021 - follow up in 2 years.   Past Medical History:  Diagnosis Date  . Allergy   . GERD (gastroesophageal reflux disease)   . Osteoarthritis     Past Surgical History:  Procedure Laterality Date  . COLONOSCOPY WITH PROPOFOL N/A 06/09/2020   Procedure: COLONOSCOPY WITH PROPOFOL;  Surgeon: Virgel Manifold, MD;  Location: [facility] END[facility]COPY;  Service: Endoscopy;  Laterality: N/A;  . NO PAST SURGERIES      Prior to Admission medications   Medication Sig Start Date End Date Taking? Authorizing Provider  esomeprazole (NEXIUM) 40 MG capsule Take 40 mg by mouth daily at 12 noon.   Yes [provider]  fexofenadine (ALLEGRA) 180 MG tablet Take 1 tablet (180 mg total) by mouth daily. 04/18/20  Yes Juline Patch, MD  ibuprofen (ADVIL) 200 MG tablet Take by mouth.   Yes [provider]  promethazine-dextromethorphan (PROMETHAZINE-DM) 6.25-15 MG/5ML syrup Take 5 mLs by mouth 4 (four) times daily as needed. Patient taking differently: Take 5 mLs by mouth 4 (four) times daily as needed. 12/13/20  Yes Margarette Canada, NP  etonogestrel (NEXPLANON) 68 MG IMPL implant 1 each (68 mg total) by Subdermal route once for 1 dose. 06/16/19 11/02/19  Will Bonnet, MD    Allergies  Allergen Reactions  . Naproxen Nausea Only    Other reaction(s): Dizziness  . Skin Adhesives  [Cyanoacrylate] Other (See Comments)    Bandaids cause skin to blister  . Vicodin [Hydrocodone-Acetaminophen] Itching    Obstetric History: G2P2  Social History   Socioeconomic History  . Marital status: Divorced    Spouse name: Not on file  . Number of children: Not on file  . Years of education: Not on file  . Highest education level: Not on file  Occupational History  . Not on file  Tobacco Use  . Smoking status: Former Smoker    Quit date: 08/27/2016    Years since quitting: 4.4  . Smokeless tobacco: Never Used  Vaping Use  . Vaping Use: Never used  Substance and Sexual Activity  . Alcohol use: Yes    Comment: occasionally  . Drug use: Never  . Sexual activity: Yes    Birth control/protection: Implant  Other Topics Concern  . Not on file  Social History Narrative  . Not on file   Social Determinants of Health   Financial Resource Strain: Not on file  Food Insecurity: Not on file  Transportation Needs: Not on file  Physical Activity: Not on file  Stress: Not on file  Social Connections: Not on file  Intimate Partner Violence: Not on file    Family History  Problem Relation Age of Onset  . Pneumonia Mother 23  . COPD Father   . Heart disease Maternal Grandmother   . Cancer Maternal Grandfather   . Heart disease Paternal  Grandmother     Review of Systems  Constitutional: Negative.   HENT: Negative.   Eyes: Negative.   Respiratory: Negative.   Cardiovascular: Negative.   Gastrointestinal: Negative.   Genitourinary: Negative.   Musculoskeletal: Negative.   Skin: Negative.   Neurological: Negative.   Psychiatric/Behavioral: Negative.      Physical Exam Vitals: BP 121/80   Ht 5' (1.524 m)   Wt 249 lb (112.9 kg)   BMI 48.63 kg/m   Physical Exam Constitutional:      General: She is not in acute distress.    Appearance: Normal appearance. She is well-developed.  Genitourinary:     Vulva and bladder normal.     Right Labia: No rash,  tenderness, lesions, skin changes or Bartholin's cyst.    Left Labia: No tenderness, lesions, skin changes, Bartholin's cyst or rash.    No inguinal adenopathy present in the right or left side.    Pelvic Tanner Score: 5/5.    No vaginal discharge, erythema, tenderness or bleeding.      Right Adnexa: not tender, not full and no mass present.    Left Adnexa: not tender, not full and no mass present.    No cervical motion tenderness, discharge, lesion or polyp.     Uterus is not enlarged or tender.     No uterine mass detected.    Pelvic exam was performed with patient in the lithotomy position.  Breasts:     Right: No inverted nipple, mass, nipple discharge, skin change or tenderness.     Left: No inverted nipple, mass, nipple discharge, skin change or tenderness.    HENT:     Head: Normocephalic and atraumatic.  Eyes:     General: No scleral icterus.    Conjunctiva/sclera: Conjunctivae normal.  Neck:     Thyroid: No thyromegaly.  Cardiovascular:     Rate and Rhythm: Normal rate and regular rhythm.     Heart sounds: No murmur heard. No friction rub. No gallop.   Pulmonary:     Effort: Pulmonary effort is normal. No respiratory distress.     Breath sounds: Normal breath sounds. No wheezing or rales.  Abdominal:     General: Bowel sounds are normal. There is no distension.     Palpations: Abdomen is soft. There is no mass.     Tenderness: There is no abdominal tenderness. There is no guarding or rebound.     Hernia: There is no hernia in the left inguinal area or right inguinal area.  Musculoskeletal:        General: No swelling or tenderness. Normal range of motion.     Cervical back: Normal range of motion and neck supple.  Lymphadenopathy:     Cervical: No cervical adenopathy.     Lower Body: No right inguinal adenopathy. No left inguinal adenopathy.  Neurological:     General: No focal deficit present.     Mental Status: She is alert and oriented to person, place, and  time.     Cranial Nerves: No cranial nerve deficit.  Skin:    General: Skin is warm and dry.     Findings: No erythema or rash.  Psychiatric:        Mood and Affect: Mood normal.        Behavior: Behavior normal.        Judgment: Judgment normal.      Female chaperone present for pelvic and breast  portions of the physical exam  Results: AUDIT Questionnaire (  screen for alcoholism): 4 PHQ-9: 0   Assessment and Plan:  55 y.o. G2P2 female here for routine annual gynecologic examination  Plan: Problem List Items Addressed This Visit   None   Visit Diagnoses    Women's annual routine gynecological examination    -  Primary   Relevant Orders   Cytology - PAP   Screening for depression       Screening for alcoholism       Pap smear for cervical cancer screening       Relevant Orders   Cytology - PAP      Screening: -- Blood pressure screen normal -- Colonoscopy - not due -- Mammogram - due. Patient to call [facility] to arrange. She understands that it is her responsibility to arrange this. -- Weight screening: obese: discussed management options, including lifestyle, dietary, and exercise. -- Depression screening negative (PHQ-9) -- Nutrition: normal -- cholesterol screening: per PCP -- osteoporosis screening: not due -- tobacco screening: not using -- alcohol screening: AUDIT questionnaire indicates low-risk usage. -- family history of breast cancer screening: done. not at high risk. -- no evidence of domestic violence or intimate partner violence. -- STD screening: gonorrhea/chlamydia NAAT not collected per patient request. -- pap smear collected per ASCCP guidelines -- flu vaccine not eligible -- HPV vaccination series: not eligilbe   Prentice Docker, MD 02/15/2021 1:47 PM

## 2021-02-21 LAB — CYTOLOGY - PAP
Comment: NEGATIVE
Diagnosis: NEGATIVE
High risk HPV: NEGATIVE

## 2021-02-28 ENCOUNTER — Encounter: Payer: Self-pay | Admitting: Family Medicine

## 2021-02-28 ENCOUNTER — Ambulatory Visit: Payer: 59 | Admitting: Family Medicine

## 2021-02-28 ENCOUNTER — Other Ambulatory Visit: Payer: Self-pay

## 2021-02-28 VITALS — BP 138/92 | HR 100 | Ht 60.0 in | Wt 248.0 lb

## 2021-02-28 DIAGNOSIS — E782 Mixed hyperlipidemia: Secondary | ICD-10-CM

## 2021-02-28 DIAGNOSIS — Z6841 Body Mass Index (BMI) 40.0 and over, adult: Secondary | ICD-10-CM

## 2021-02-28 DIAGNOSIS — Z1231 Encounter for screening mammogram for malignant neoplasm of breast: Secondary | ICD-10-CM

## 2021-02-28 NOTE — Patient Instructions (Signed)

## 2021-02-28 NOTE — Progress Notes (Addendum)
Date:  02/28/2021   Name:  Julia Gallegos   DOB:  [birth year]   MRN:  [id number]   Chief Complaint: breast exam  Patient is a 55 year old female who presents for a breast exam. The patient reports the following problems: baseline labs. Health maintenance has been reviewed mammogram  Hyperlipidemia This is a chronic problem. The current episode started more than 1 year ago. The problem is controlled. Recent lipid tests were reviewed and are variable. Pertinent negatives include no chest pain, focal sensory loss, focal weakness, leg pain, myalgias or shortness of breath. Current antihyperlipidemic treatment includes diet change.    No results found for: CREATININE, BUN, NA, K, CL, CO2 No results found for: CHOL, HDL, LDLCALC, LDLDIRECT, TRIG, CHOLHDL No results found for: TSH No results found for: HGBA1C No results found for: WBC, HGB, HCT, MCV, PLT No results found for: ALT, AST, GGT, ALKPHOS, BILITOT   Review of Systems  Constitutional: Negative.  Negative for chills, fatigue, fever and unexpected weight change.  HENT: Positive for congestion. Negative for ear discharge, ear pain, rhinorrhea, sinus pressure, sneezing and sore throat.   Eyes: Negative for photophobia, pain, discharge, redness and itching.  Respiratory: Negative for cough, shortness of breath, wheezing and stridor.   Cardiovascular: Negative for chest pain.  Gastrointestinal: Negative for abdominal pain, blood in stool, constipation, diarrhea, nausea and vomiting.  Endocrine: Negative for cold intolerance, heat intolerance, polydipsia, polyphagia and polyuria.  Genitourinary: Negative for dysuria, flank pain, frequency, hematuria, menstrual problem, pelvic pain, urgency, vaginal bleeding and vaginal discharge.  Musculoskeletal: Negative for arthralgias, back pain and myalgias.  Skin: Negative for rash.  Allergic/Immunologic: Negative for environmental allergies and food allergies.  Neurological: Negative for dizziness,  focal weakness, weakness, light-headedness, numbness and headaches.  Hematological: Negative for adenopathy. Does not bruise/bleed easily.  Psychiatric/Behavioral: Negative for dysphoric mood. The patient is not nervous/anxious.     Patient Active Problem List   Diagnosis Date Noted  . Encounter for screening colonoscopy   . Polyp of colon     Allergies  Allergen Reactions  . Naproxen Nausea Only    Other reaction(s): Dizziness  . Skin Adhesives [Cyanoacrylate] Other (See Comments)    Bandaids cause skin to blister  . Vicodin [Hydrocodone-Acetaminophen] Itching    Past Surgical History:  Procedure Laterality Date  . COLONOSCOPY WITH PROPOFOL N/A 06/09/2020   Procedure: COLONOSCOPY WITH PROPOFOL;  Surgeon: Virgel Manifold, MD;  Location: [facility] ENDOSCOPY;  Service: Endoscopy;  Laterality: N/A;  . NO PAST SURGERIES      Social History   Tobacco Use  . Smoking status: Former Smoker    Quit date: 08/27/2016    Years since quitting: 4.5  . Smokeless tobacco: Never Used  Vaping Use  . Vaping Use: Never used  Substance Use Topics  . Alcohol use: Yes    Comment: occasionally  . Drug use: Never     Medication list has been reviewed and updated.  Current Meds  Medication Sig  . esomeprazole (NEXIUM) 40 MG capsule Take 40 mg by mouth daily at 12 noon.  . etonogestrel (NEXPLANON) 68 MG IMPL implant 1 each (68 mg total) by Subdermal route once for 1 dose.  . fexofenadine (ALLEGRA) 180 MG tablet Take 1 tablet (180 mg total) by mouth daily.  Marland Kitchen ibuprofen (ADVIL) 200 MG tablet Take by mouth.    PHQ 2/9 Scores 02/28/2021 02/15/2021 04/18/2020  PHQ - 2 Score 0 0 0  PHQ- 9 Score 0 0  0    GAD 7 : Generalized Anxiety Score 02/28/2021 04/18/2020  Nervous, Anxious, on Edge 0 0  Control/stop worrying 0 0  Worry too much - different things 0 0  Trouble relaxing 0 0  Restless 0 0  Easily annoyed or irritable 0 0  Afraid - awful might happen 0 0  Total GAD 7 Score 0 0    BP  Readings from Last 3 Encounters:  02/28/21 (!) 138/92  02/15/21 121/80  12/13/20 (!) 155/86    Physical Exam Vitals and nursing note reviewed.  Constitutional:      General: She is not in acute distress.    Appearance: She is not diaphoretic.  HENT:     Head: Normocephalic and atraumatic.     Right Ear: Tympanic membrane and external ear normal.     Left Ear: Tympanic membrane and external ear normal.     Nose: Nose normal. No congestion or rhinorrhea.     Mouth/Throat:     Mouth: Mucous membranes are moist.  Eyes:     General:        Right eye: No discharge.        Left eye: No discharge.     Conjunctiva/sclera: Conjunctivae normal.     Pupils: Pupils are equal, round, and reactive to light.  Neck:     Thyroid: No thyromegaly.     Vascular: No JVD.  Cardiovascular:     Rate and Rhythm: Normal rate and regular rhythm.     Heart sounds: Normal heart sounds. No murmur heard. No friction rub. No gallop.   Pulmonary:     Effort: Pulmonary effort is normal. No respiratory distress.     Breath sounds: Normal breath sounds. No stridor. No wheezing, rhonchi or rales.  Chest:     Chest wall: No tenderness.  Breasts:     Right: Normal. No swelling, bleeding, inverted nipple, mass, nipple discharge, skin change, tenderness, axillary adenopathy or supraclavicular adenopathy.     Left: Normal. No swelling, bleeding, inverted nipple, mass, nipple discharge, skin change, tenderness, axillary adenopathy or supraclavicular adenopathy.    Abdominal:     General: Bowel sounds are normal.     Palpations: Abdomen is soft. There is no mass.     Tenderness: There is no abdominal tenderness. There is no guarding.  Musculoskeletal:        General: Normal range of motion.     Cervical back: Normal range of motion and neck supple.  Lymphadenopathy:     Cervical: No cervical adenopathy.     Upper Body:     Right upper body: No supraclavicular or axillary adenopathy.     Left upper body: No  supraclavicular or axillary adenopathy.  Skin:    General: Skin is warm and dry.     Findings: No bruising or erythema.  Neurological:     Mental Status: She is alert.     Deep Tendon Reflexes: Reflexes are normal and symmetric.     Wt Readings from Last 3 Encounters:  02/28/21 248 lb (112.5 kg)  02/15/21 249 lb (112.9 kg)  12/13/20 250 lb (113.4 kg)    BP (!) 138/92   Pulse 100   Ht 5' (1.524 m)   Wt 248 lb (112.5 kg)   SpO2 98%   BMI 48.43 kg/m   Assessment and Plan:  1. Mixed hyperlipidemia Chronic.  Uncontrolled.  Stable.  Patient's had mild elevations in the past when labs have been done at employment.  We  will check a lipid panel and renal function panel for current status. - Lipid Panel With LDL/HDL Ratio - Renal Function Panel  2. Breast cancer screening by mammogram Discussion of breast cancer screening with patient breast exam was done with no palpable masses or noted concerns.  We will schedule bilateral mammogram. - MM 3D SCREEN BREAST BILATERAL; Future  3. BMI 40.0-44.9, adult [facility]) Patient has a BMI of 48 which we have discussed weight loss.  Patient has been given to diet concentrating on low-cholesterol low triglyceride. - Lipid Panel With LDL/HDL Ratio - Renal Function Panel

## 2021-03-01 LAB — RENAL FUNCTION PANEL
Albumin: 3.9 g/dL (ref 3.8–4.9)
BUN/Creatinine Ratio: 16 (ref 9–23)
BUN: 11 mg/dL (ref 6–24)
CO2: 21 mmol/L (ref 20–29)
Calcium: 9.3 mg/dL (ref 8.7–10.2)
Chloride: 100 mmol/L (ref 96–106)
Creatinine, Ser: 0.67 mg/dL (ref 0.57–1.00)
Glucose: 131 mg/dL — ABNORMAL HIGH (ref 65–99)
Phosphorus: 2.7 mg/dL — ABNORMAL LOW (ref 3.0–4.3)
Potassium: 4.1 mmol/L (ref 3.5–5.2)
Sodium: 137 mmol/L (ref 134–144)
eGFR: 104 mL/min/{1.73_m2} (ref >59)

## 2021-03-01 LAB — LIPID PANEL WITH LDL/HDL RATIO
Cholesterol, Total: 187 mg/dL (ref 100–199)
HDL: 30 mg/dL — ABNORMAL LOW (ref >39)
LDL Chol Calc (NIH): 135 mg/dL — ABNORMAL HIGH (ref 0–99)
LDL/HDL Ratio: 4.5 ratio — ABNORMAL HIGH (ref 0.0–3.2)
Triglycerides: 118 mg/dL (ref 0–149)
VLDL Cholesterol Cal: 22 mg/dL (ref 5–40)

## 2021-03-06 ENCOUNTER — Inpatient Hospital Stay: Admission: RE | Admit: 2021-03-06 | Payer: 59 | Source: Ambulatory Visit

## 2021-03-14 ENCOUNTER — Other Ambulatory Visit: Payer: Self-pay | Admitting: Family Medicine

## 2021-03-14 ENCOUNTER — Ambulatory Visit
Admission: RE | Admit: 2021-03-14 | Discharge: 2021-03-14 | Disposition: A | Discharge status: Home / Self Care | Payer: 59 | Source: Ambulatory Visit | Attending: Family Medicine | Admitting: Family Medicine

## 2021-03-14 ENCOUNTER — Other Ambulatory Visit: Payer: Self-pay

## 2021-03-14 DIAGNOSIS — N6489 Other specified disorders of breast: Secondary | ICD-10-CM

## 2021-03-14 DIAGNOSIS — Z1231 Encounter for screening mammogram for malignant neoplasm of breast: Secondary | ICD-10-CM | POA: Diagnosis not present

## 2021-03-14 DIAGNOSIS — R928 Other abnormal and inconclusive findings on diagnostic imaging of breast: Secondary | ICD-10-CM

## 2021-03-20 ENCOUNTER — Ambulatory Visit
Admission: RE | Admit: 2021-03-20 | Discharge: 2021-03-20 | Disposition: A | Discharge status: Home / Self Care | Payer: 59 | Source: Ambulatory Visit | Attending: Family Medicine | Admitting: Family Medicine

## 2021-03-20 ENCOUNTER — Other Ambulatory Visit: Payer: Self-pay

## 2021-03-20 ENCOUNTER — Other Ambulatory Visit: Payer: Self-pay | Admitting: Family Medicine

## 2021-03-20 DIAGNOSIS — R928 Other abnormal and inconclusive findings on diagnostic imaging of breast: Secondary | ICD-10-CM | POA: Insufficient documentation

## 2021-03-20 DIAGNOSIS — N6489 Other specified disorders of breast: Secondary | ICD-10-CM | POA: Diagnosis not present

## 2021-03-20 DIAGNOSIS — R922 Inconclusive mammogram: Secondary | ICD-10-CM | POA: Diagnosis not present

## 2021-03-20 DIAGNOSIS — N6320 Unspecified lump in the left breast, unspecified quadrant: Secondary | ICD-10-CM

## 2021-03-22 ENCOUNTER — Telehealth: Payer: Self-pay

## 2021-03-22 NOTE — Telephone Encounter (Unsigned)
Copied from [place] [id number]. Topic: General - Other >> Mar 22, 2021 10:00 AM Tessa Lerner A wrote: Reason for CRM: Patient would like to be contacted by staff member "Baxter Flattery" when available   Patient has additional concerns/questions related to biopsy scheduling  Please contact to further advise

## 2021-03-22 NOTE — Telephone Encounter (Signed)
Patient informed biopsy has been ordered and [facility] will call her to schedule this procedure. Dr Ronnald Ramp did order this.

## 2021-04-03 ENCOUNTER — Ambulatory Visit
Admission: RE | Admit: 2021-04-03 | Discharge: 2021-04-03 | Disposition: A | Discharge status: Home / Self Care | Payer: 59 | Source: Ambulatory Visit | Attending: Family Medicine | Admitting: Family Medicine

## 2021-04-03 ENCOUNTER — Other Ambulatory Visit: Payer: Self-pay

## 2021-04-03 DIAGNOSIS — R928 Other abnormal and inconclusive findings on diagnostic imaging of breast: Secondary | ICD-10-CM | POA: Diagnosis present

## 2021-04-03 DIAGNOSIS — N6320 Unspecified lump in the left breast, unspecified quadrant: Secondary | ICD-10-CM | POA: Insufficient documentation

## 2021-04-03 DIAGNOSIS — Z7689 Persons encountering health services in other specified circumstances: Secondary | ICD-10-CM | POA: Diagnosis not present

## 2021-04-03 HISTORY — PX: BREAST BIOPSY: SHX20

## 2021-04-04 LAB — SURGICAL PATHOLOGY

## 2021-04-04 NOTE — Progress Notes (Signed)
Received notification of benign biopsy from Julia Gallegos at [facility] , with surgical consult recommendation.  Patient is scheduled with Dr. Christian Mate on 04/12/21 at 11:15 per Raquel Sarna at [facility].  Notified patient of appointment.

## 2021-04-06 ENCOUNTER — Other Ambulatory Visit: Payer: Self-pay

## 2021-04-06 ENCOUNTER — Ambulatory Visit: Payer: 59 | Admitting: Family Medicine

## 2021-04-06 ENCOUNTER — Encounter: Payer: Self-pay | Admitting: Family Medicine

## 2021-04-06 VITALS — BP 130/90 | HR 78 | Ht 60.0 in | Wt 248.0 lb

## 2021-04-06 DIAGNOSIS — H8110 Benign paroxysmal vertigo, unspecified ear: Secondary | ICD-10-CM | POA: Diagnosis not present

## 2021-04-06 MED ORDER — MECLIZINE HCL 25 MG PO TABS: 25.0000 mg | ORAL_TABLET | Freq: Three times a day (TID) | ORAL | 0 refills | Status: DC | PRN

## 2021-04-06 NOTE — Patient Instructions (Signed)
Vertigo Vertigo is the feeling that you or your surroundings are moving when they are not. This feeling can come and go at any time. Vertigo often goes away on its own. Vertigo can be dangerous if it occurs while you are doing something thatcould endanger yourself or others, such as driving or operating machinery. Your health care provider will do tests to try to determine the cause of your vertigo. Tests will also help your health care provider decide how best totreat your condition. Follow these instructions at home: Eating and drinking     Dehydration can make vertigo worse. Drink enough fluid to keep your urine pale yellow. Do not drink alcohol. Activity Return to your normal activities as told by your health care provider. Ask your health care provider what activities are safe for you. In the morning, first sit up on the side of the bed. When you feel okay, stand slowly while you hold onto something until you know that your balance is fine. Move slowly. Avoid sudden body or head movements or certain positions, as told by your health care provider. If you have trouble walking or keeping your balance, try using a cane for stability. If you feel dizzy or unstable, sit down right away. Avoid doing any tasks that would cause danger to you or others if vertigo occurs. Avoid bending down if you feel dizzy. Place items in your home so that they are easy for you to reach without bending or leaning over. Do not drive or use machinery if you feel dizzy. General instructions Take over-the-counter and prescription medicines only as told by your health care provider. Keep all follow-up visits. This is important. Contact a health care provider if: Your medicines do not relieve your vertigo or they make it worse. Your condition gets worse or you develop new symptoms. You have a fever. You develop nausea or vomiting, or if nausea gets worse. Your family or friends notice any behavioral changes. You  have numbness or a prickling and tingling sensation in part of your body. Get help right away if you: Are always dizzy or you faint. Develop severe headaches. Develop a stiff neck. Develop sensitivity to light. Have difficulty moving or speaking. Have weakness in your hands, arms, or legs. Have changes in your hearing or vision. These symptoms may represent a serious problem that is an emergency. Do not wait to see if the symptoms will go away. Get medical help right away. Call your local emergency services (911 in the [place] Do not drive yourself to the hospital. Summary Vertigo is the feeling that you or your surroundings are moving when they are not. Your health care provider will do tests to try to determine the cause of your vertigo. Follow instructions for home care. You may be told to avoid certain tasks, positions, or movements. Contact a health care provider if your medicines do not relieve your symptoms, or if you have a fever, nausea, vomiting, or changes in behavior. Get help right away if you have severe headaches or difficulty speaking, or you develop hearing or vision problems. This information is not intended to replace advice given to you by your health care provider. Make sure you discuss any questions you have with your healthcare provider. Document Revised: 09/13/2020 Document Reviewed: 09/13/2020 Elsevier Patient Education  2022 [employer]. How to Perform the Epley Maneuver The Epley maneuver is an exercise that relieves symptoms of vertigo. Vertigo is the feeling that you or your surroundings are moving when they are  not. When you feel vertigo, you may feel like the room is spinning and may have trouble walking. The Epley maneuver is used for a type of vertigo caused by a calcium deposit in a part of the inner ear. The maneuver involves changing headpositions to help the deposit move out of the area. You can do this maneuver at home whenever you have symptoms of vertigo.  You canrepeat it in 24 hours if your vertigo has not gone away. Even though the Epley maneuver may relieve your vertigo for a few weeks, it is possible that your symptoms will return. This maneuver relieves vertigo, but itdoes not relieve dizziness. What are the risks? If it is done correctly, the Epley maneuver is considered safe. Sometimes it can lead to dizziness or nausea that goes away after a short time. If you develop other symptoms--such as changes in vision, weakness, or numbness--stopdoing the maneuver and call your health care provider. Supplies needed: A bed or table. A pillow. How to do the Epley maneuver     Sit on the edge of a bed or table with your back straight and your legs extended or hanging over the edge of the bed or table. Turn your head halfway toward the affected ear or side as told by your health care provider. Lie backward quickly with your head turned until you are lying flat on your back. Your head should dangle (head-hanging position). You may want to position a pillow under your shoulders. Hold this position for at least 30 seconds. If you feel dizzy or have symptoms of vertigo, continue to hold the position until the symptoms stop. Turn your head to the opposite direction until your unaffected ear is facing down. Your head should continue to dangle. Hold this position for at least 30 seconds. If you feel dizzy or have symptoms of vertigo, continue to hold the position until the symptoms stop. Turn your whole body to the same side as your head so that you are positioned on your side. Your head will now be nearly facedown and no longer needs to dangle. Hold for at least 30 seconds. If you feel dizzy or have symptoms of vertigo, continue to hold the position until the symptoms stop. Sit back up. You can repeat the maneuver in 24 hours if your vertigo does not go away. Follow these instructions at home: For 24 hours after doing the Epley maneuver: Keep your head in  an upright position. When lying down to sleep or rest, keep your head raised (elevated) with two or more pillows. Avoid excessive neck movements. Activity Do not drive or use machinery if you feel dizzy. After doing the Epley maneuver, return to your normal activities as told by your health care provider. Ask your health care provider what activities are safe for you. General instructions Drink enough fluid to keep your urine pale yellow. Do not drink alcohol. Take over-the-counter and prescription medicines only as told by your health care provider. Keep all follow-up visits. This is important. Preventing vertigo symptoms Ask your health care provider if there is anything you should do at home to prevent vertigo. He or she may recommend that you: Keep your head elevated with two or more pillows while you sleep. Do not sleep on the side of your affected ear. Get up slowly from bed. Avoid sudden movements during the day. Avoid extreme head positions or movement, such as looking up or bending over. Contact a health care provider if: Your vertigo gets worse. You  have other symptoms, including: Nausea. Vomiting. Headache. Get help right away if you: Have vision changes. Have a headache or neck pain that is severe or getting worse. Cannot stop vomiting. Have new numbness or weakness in any part of your body. These symptoms may represent a serious problem that is an emergency. Do not wait to see if the symptoms will go away. Get medical help right away. Call your local emergency services (911 in the [place] Do not drive yourself to the hospital. Summary Vertigo is the feeling that you or your surroundings are moving when they are not. The Epley maneuver is an exercise that relieves symptoms of vertigo. If the Epley maneuver is done correctly, it is considered safe. This information is not intended to replace advice given to you by your health care provider. Make sure you discuss any  questions you have with your healthcare provider. Document Revised: 09/13/2020 Document Reviewed: 09/13/2020 Elsevier Patient Education  2022 [employer].

## 2021-04-06 NOTE — Progress Notes (Signed)
Date:  04/06/2021   Name:  Julia Gallegos   DOB:  05/07/1966   MRN:  [id number]   Chief Complaint: Dizziness (When laying down and has to sit still for a minute before getting up and walking. No dizziness while walking x 12 days)  Dizziness This is a new problem. The current episode started 1 to 4 weeks ago (12 days). The problem occurs daily. The problem has been waxing and waning. Associated symptoms include vertigo. Pertinent negatives include no abdominal pain, chest pain, chills, coughing, fever, headaches, myalgias, nausea, neck pain, rash or sore throat. Exacerbated by: neck extension and supine rolling over. She has tried nothing for the symptoms.   Lab Results  Component Value Date   CREATININE 0.67 02/28/2021   BUN 11 02/28/2021   NA 137 02/28/2021   K 4.1 02/28/2021   CL 100 02/28/2021   CO2 21 02/28/2021   Lab Results  Component Value Date   CHOL 187 02/28/2021   HDL 30 (L) 02/28/2021   LDLCALC 135 (H) 02/28/2021   TRIG 118 02/28/2021   No results found for: TSH No results found for: HGBA1C No results found for: WBC, HGB, HCT, MCV, PLT No results found for: ALT, AST, GGT, ALKPHOS, BILITOT   Review of Systems  Constitutional:  Negative for chills and fever.  HENT:  Negative for drooling, ear discharge, ear pain and sore throat.   Respiratory:  Negative for cough, shortness of breath and wheezing.   Cardiovascular:  Negative for chest pain, palpitations and leg swelling.  Gastrointestinal:  Negative for abdominal pain, blood in stool, constipation, diarrhea and nausea.  Endocrine: Negative for polydipsia.  Genitourinary:  Negative for dysuria, frequency, hematuria and urgency.  Musculoskeletal:  Negative for back pain, myalgias and neck pain.  Skin:  Negative for rash.  Allergic/Immunologic: Negative for environmental allergies.  Neurological:  Positive for dizziness and vertigo. Negative for headaches.  Hematological:  Does not bruise/bleed easily.   Psychiatric/Behavioral:  Negative for suicidal ideas. The patient is not nervous/anxious.    Patient Active Problem List   Diagnosis Date Noted   Encounter for screening colonoscopy    Polyp of colon     Allergies  Allergen Reactions   Naproxen Nausea Only    Other reaction(s): Dizziness   Skin Adhesives [Cyanoacrylate] Other (See Comments)    Bandaids cause skin to blister   Vicodin [Hydrocodone-Acetaminophen] Itching    Past Surgical History:  Procedure Laterality Date   BREAST BIOPSY Left 04/03/2021   affirm bx, ribbon marker, radial scar   COLONOSCOPY WITH PROPOFOL N/A 06/09/2020   Procedure: COLONOSCOPY WITH PROPOFOL;  Surgeon: Virgel Manifold, MD;  Location: [facility];  Service: Endoscopy;  Laterality: N/A;   NO PAST SURGERIES      Social History   Tobacco Use   Smoking status: Former    Pack years: 0.00    Types: Cigarettes    Quit date: 08/27/2016    Years since quitting: 4.6   Smokeless tobacco: Never  Vaping Use   Vaping Use: Never used  Substance Use Topics   Alcohol use: Yes    Comment: occasionally   Drug use: Never     Medication list has been reviewed and updated.  Current Meds  Medication Sig   esomeprazole (NEXIUM) 40 MG capsule Take 40 mg by mouth daily at 12 noon.   etonogestrel (NEXPLANON) 68 MG IMPL implant 1 each (68 mg total) by Subdermal route once for 1 dose.   fexofenadine (  ALLEGRA) 180 MG tablet Take 1 tablet (180 mg total) by mouth daily.   ibuprofen (ADVIL) 200 MG tablet Take by mouth.    PHQ 2/9 Scores 04/06/2021 02/28/2021 02/15/2021 04/18/2020  PHQ - 2 Score 0 0 0 0  PHQ- 9 Score 0 0 0 0    GAD 7 : Generalized Anxiety Score 04/06/2021 02/28/2021 04/18/2020  Nervous, Anxious, on Edge 0 0 0  Control/stop worrying 0 0 0  Worry too much - different things 0 0 0  Trouble relaxing 0 0 0  Restless 0 0 0  Easily annoyed or irritable 0 0 0  Afraid - awful might happen 0 0 0  Total GAD 7 Score 0 0 0    BP Readings from Last  3 Encounters:  04/06/21 130/90  02/28/21 (!) 138/92  02/15/21 121/80    Physical Exam Vitals and nursing note reviewed.  Constitutional:      General: She is not in acute distress.    Appearance: She is not diaphoretic.  HENT:     Head: Normocephalic and atraumatic.     Right Ear: External ear normal.     Left Ear: External ear normal.     Nose: Nose normal.  Eyes:     General:        Right eye: No discharge.        Left eye: No discharge.     Conjunctiva/sclera: Conjunctivae normal.     Pupils: Pupils are equal, round, and reactive to light.  Neck:     Thyroid: No thyromegaly.     Vascular: No JVD.  Cardiovascular:     Rate and Rhythm: Normal rate and regular rhythm.     Heart sounds: Normal heart sounds. No murmur heard.   No friction rub. No gallop.  Pulmonary:     Effort: Pulmonary effort is normal.     Breath sounds: Normal breath sounds.  Abdominal:     General: Bowel sounds are normal.     Palpations: Abdomen is soft. There is no mass.     Tenderness: There is no abdominal tenderness. There is no guarding.  Musculoskeletal:        General: Normal range of motion.     Cervical back: Normal range of motion and neck supple.  Lymphadenopathy:     Cervical: No cervical adenopathy.  Skin:    General: Skin is warm and dry.  Neurological:     Mental Status: She is alert.     Cranial Nerves: Cranial nerves are intact. No cranial nerve deficit.     Sensory: Sensation is intact. No sensory deficit.     Motor: Motor function is intact. No weakness.     Deep Tendon Reflexes: Reflexes are normal and symmetric.    Wt Readings from Last 3 Encounters:  04/06/21 248 lb (112.5 kg)  02/28/21 248 lb (112.5 kg)  02/15/21 249 lb (112.9 kg)    BP 130/90   Pulse 78   Ht 5' (1.524 m)   Wt 248 lb (112.5 kg)   BMI 48.43 kg/m   Assessment and Plan:  1. Benign paroxysmal positional vertigo, unspecified laterality New onset.  Persistent.  Stable.  Exam and history is  consistent with benign positional vertigo presumably from a labyrinthitis like concern.  Patient has been given information on vertigo and suggestions on how to do an Epley maneuver that she can do on a as needed basis.  Patient is also being given a prescription for meclizine 25 mg to  be used as needed dizziness or presumably before she goes to bed at night.  Patient is to call if this continues by midweek and her next step would be referral to ear nose and throat.

## 2021-04-10 ENCOUNTER — Other Ambulatory Visit: Payer: Self-pay | Admitting: Family Medicine

## 2021-04-10 DIAGNOSIS — L508 Other urticaria: Secondary | ICD-10-CM

## 2021-04-10 NOTE — Telephone Encounter (Signed)
Requested medications are due for refill today yes  Requested medications are on the active medication list yes  Last refill 01/10/21  Last visit Do not see this med/dx addressed in a visit  Future visit scheduled no, note says visit canceled due to "moved"  Notes to clinic Please assess.

## 2021-04-10 NOTE — Telephone Encounter (Signed)
   Notes to clinic Sorry, this med/dx, was addressed in June 2021, no upcoming visit, notes states "moved".

## 2021-04-12 ENCOUNTER — Encounter: Payer: Self-pay | Admitting: Surgery

## 2021-04-12 ENCOUNTER — Ambulatory Visit: Payer: Self-pay | Admitting: Surgery

## 2021-04-12 ENCOUNTER — Ambulatory Visit: Payer: 59 | Admitting: Surgery

## 2021-04-12 ENCOUNTER — Other Ambulatory Visit: Payer: Self-pay

## 2021-04-12 ENCOUNTER — Other Ambulatory Visit: Payer: Self-pay | Admitting: Surgery

## 2021-04-12 ENCOUNTER — Ambulatory Visit: Payer: Self-pay | Admitting: *Deleted

## 2021-04-12 ENCOUNTER — Telehealth: Payer: Self-pay | Admitting: Surgery

## 2021-04-12 VITALS — BP 137/80 | HR 102 | Temp 98.2°F | Ht 60.0 in | Wt 246.8 lb

## 2021-04-12 DIAGNOSIS — N6489 Other specified disorders of breast: Secondary | ICD-10-CM | POA: Diagnosis not present

## 2021-04-12 NOTE — Telephone Encounter (Signed)
  Pt saw Dr. Ronnald Ramp 04/06/21,  [facility]. States she was instructed to call if symptoms of vertigo did not resolve and  Dr. Ronnald Ramp would order referral to ENT. States vertigo "No worse, but now occurring when bending over, I would like the referral."  Assured pt NT would route to practice for PCPs review.  CB3 [phone]  Reason for Disposition  [1] MILD dizziness (e.g., vertigo; walking normally) AND [2] has been evaluated by physician for this  Answer Assessment - Initial Assessment Questions 1. DESCRIPTION: "Describe your dizziness."     See summary, calling for referral that was discussed. 2. VERTIGO: "Do you feel like either you or the room is spinning or tilting?"      *No Answer* 3. LIGHTHEADED: "Do you feel lightheaded?" (e.g., somewhat faint, woozy, weak upon standing)     *No Answer* 4. SEVERITY: "How bad is it?"  "Can you walk?"   - MILD: Feels slightly dizzy and unsteady, but is walking normally.   - MODERATE: Feels unsteady when walking, but not falling; interferes with normal activities (e.g., school, work).   - SEVERE: Unable to walk without falling, or requires assistance to walk without falling.     *No Answer* 5. ONSET:  "When did the dizziness begin?"     *No Answer* 6. AGGRAVATING FACTORS: "Does anything make it worse?" (e.g., standing, change in head position)     *No Answer* 7. CAUSE: "What do you think is causing the dizziness?"     *No Answer* 8. RECURRENT SYMPTOM: "Have you had dizziness before?" If Yes, ask: "When was the last time?" "What happened that time?"     *No Answer* 9. OTHER SYMPTOMS: "Do you have any other symptoms?" (e.g., headache, weakness, numbness, vomiting, earache)     *No Answer* 10. PREGNANCY: "Is there any chance you are pregnant?" "When was your last menstrual period?"       *No Answer*  Protocols used: Dizziness - Vertigo-A-AH

## 2021-04-12 NOTE — Telephone Encounter (Signed)
Patient has been advised of Pre-Admission date/time, COVID Testing date and Surgery date.  Surgery Date: 05/25/21 Preadmission Testing Date: 05/18/21 (phone 8a-1p) Covid Testing Date: Not needed.     Patient has been made aware to call [phone], between 1-3:00pm the day before surgery, to find out what time to arrive for surgery.    Patient also reminded of her RF tag scheduled at [place] for 05/09/21.   Patient verbalized understanding.

## 2021-04-12 NOTE — Progress Notes (Signed)
Patient ID: Julia Gallegos, female   DOB: 1966/01/28, 55 y.o.   MRN: [id number]  Chief Complaint: Radial scar left breast  History of Present Illness Julia Gallegos is a 55 y.o. female with mammographically detected lesion, not appreciated on ultrasound.  Stereotactic biopsy reveals a radial scar without atypia associated.  She is referred for excisional biopsy. She has an implanted birth control device which may eventually be removed so she can go through menopause.  She has no family history of breast cancer.  She had 2 pregnancies delivering her first child the age of 20.  She did breast-feed.  Despite normal monthly breast exams she has no breast changes to speak of.  Past Medical History Past Medical History:  Diagnosis Date   Allergy    GERD (gastroesophageal reflux disease)    Osteoarthritis       Past Surgical History:  Procedure Laterality Date   BREAST BIOPSY Left 04/03/2021   affirm bx, ribbon marker, radial scar   COLONOSCOPY WITH PROPOFOL N/A 06/09/2020   Procedure: COLONOSCOPY WITH PROPOFOL;  Surgeon: Virgel Manifold, MD;  Location: [facility] ENDOSCOPY;  Service: Endoscopy;  Laterality: N/A;   NO PAST SURGERIES      Allergies  Allergen Reactions   Naproxen Nausea Only    Other reaction(s): Dizziness   Skin Adhesives [Cyanoacrylate] Other (See Comments)    Bandaids cause skin to blister   Vicodin [Hydrocodone-Acetaminophen] Itching    Current Outpatient Medications  Medication Sig Dispense Refill   esomeprazole (NEXIUM) 40 MG capsule Take 40 mg by mouth daily at 12 noon.     etonogestrel (NEXPLANON) 68 MG IMPL implant 1 each (68 mg total) by Subdermal route once for 1 dose. 1 each 0   fexofenadine (ALLEGRA) 180 MG tablet TAKE 1 TABLET BY MOUTH EVERY DAY 90 tablet 0   ibuprofen (ADVIL) 200 MG tablet Take by mouth.     meclizine (ANTIVERT) 25 MG tablet Take 1 tablet (25 mg total) by mouth 3 (three) times daily as needed for dizziness. 30 tablet 0   No current  facility-administered medications for this visit.    Family History Family History  Problem Relation Age of Onset   Pneumonia Mother 37   COPD Father    Heart disease Maternal Grandmother    Cancer Maternal Grandfather    Heart disease Paternal Grandmother    Breast cancer Neg Hx       Social History Social History   Tobacco Use   Smoking status: Former    Pack years: 0.00    Types: Cigarettes    Quit date: 08/27/2016    Years since quitting: 4.6   Smokeless tobacco: Never  Vaping Use   Vaping Use: Never used  Substance Use Topics   Alcohol use: Yes    Comment: occasionally   Drug use: Never        Review of Systems  Constitutional: Negative.   HENT: Negative.    Eyes:  Positive for blurred vision.  Respiratory: Negative.    Cardiovascular: Negative.   Gastrointestinal: Negative.   Genitourinary: Negative.   Skin:  Positive for rash ("Heat allergy").  Neurological:  Positive for dizziness.  Psychiatric/Behavioral: Negative.       Physical Exam Blood pressure 137/80, pulse (!) 102, temperature 98.2 F (36.8 C), temperature source Oral, height 5' (1.524 m), weight 246 lb 12.8 oz (111.9 kg), SpO2 99 %. Last Weight  Most recent update: 04/12/2021 11:30 AM    Weight  111.9 kg (246  lb 12.8 oz)             CONSTITUTIONAL: Well developed, and nourished, appropriately responsive and aware without distress.  Morbidly obese. EYES: Sclera non-icteric.   EARS, NOSE, MOUTH AND THROAT: Mask worn.   Hearing is intact to voice.  NECK: Trachea is midline, and there is no jugular venous distension.  LYMPH NODES:  Lymph nodes in the neck are not appreciable. RESPIRATORY:  Lungs are clear, and breath sounds are equal bilaterally. Normal respiratory effort without pathologic use of accessory muscles. CARDIOVASCULAR: Heart is regular in rate and rhythm. GI: The abdomen is soft, nontender, and nondistended. There were no palpable masses. I did not appreciate  hepatosplenomegaly. There were normal bowel sounds. GU: Breast exam: Levada Dy present as chaperone.  Postbiopsy ecchymosis noted.  No appreciable dominant or suspicious masses. MUSCULOSKELETAL:  Symmetrical muscle tone appreciated in all four extremities.    SKIN: Skin turgor is normal. No pathologic skin lesions appreciated.  NEUROLOGIC:  Motor and sensation appear grossly normal.  Cranial nerves are grossly without defect. PSYCH:  Alert and oriented to person, place and time. Affect is appropriate for situation.  Data Reviewed I have personally reviewed what is currently available of the patient's imaging, recent labs and medical records.   Labs:  No flowsheet data found. CMP Latest Ref Rng & Units 02/28/2021  Glucose 65 - 99 mg/dL 131(H)  BUN 6 - 24 mg/dL 11  Creatinine 0.57 - 1.00 mg/dL 0.67  Sodium 134 - 144 mmol/L 137  Potassium 3.5 - 5.2 mmol/L 4.1  Chloride 96 - 106 mmol/L 100  CO2 20 - 29 mmol/L 21  Calcium 8.7 - 10.2 mg/dL 9.3   SURGICAL PATHOLOGY  CASE: [id number]  PATIENT: Julia Gallegos  Surgical Pathology Report  Specimen Submitted:  A. Breast, left   Clinical History: Ribbon clip is 4.8 mm inferior to the biopsied mass.   DIAGNOSIS:  A. LEFT BREAST MASS; STEREOTACTIC BIOPSY:  - BENIGN BREAST TISSUE WITH:       - RADIAL SCAR       - FIBROCYSTIC CHANGES       - USUAL DUCTAL HYPERPLASIA  - NEGATIVE FOR ATYPIA AND MALIGNANCY.    Imaging: Radiology review:  CLINICAL DATA:  Evaluate biopsy marker   EXAM: DIAGNOSTIC LEFT MAMMOGRAM POST STEREOTACTIC BIOPSY   COMPARISON:  Previous   FINDINGS: Mammographic images were obtained following stereotactic guided biopsy of a left breast mass. The ribbon shaped biopsy marker is in close proximity to the biopsied mass. The marker appears to be 4.8 mm inferior to the biopsied mass.   IMPRESSION: The ribbon shaped clip is in close proximity to the biopsied mass. There is air in and around the biopsied mass. The clip is  4.8 mm inferior to the biopsied mass.   Final Assessment: Post Procedure Mammograms for Marker Placement  CLINICAL DATA:  Patient returns after screening study for evaluation of possible LEFT breast asymmetry.   EXAM: DIGITAL DIAGNOSTIC UNILATERAL LEFT MAMMOGRAM WITH TOMOSYNTHESIS AND CAD; ULTRASOUND LEFT BREAST LIMITED   TECHNIQUE: Left digital diagnostic mammography and breast tomosynthesis was performed. The images were evaluated with computer-aided detection.; Targeted ultrasound examination of the left breast was performed   COMPARISON:  Previous exam(s).   ACR Breast Density Category b: There are scattered areas of fibroglandular density.   FINDINGS: Additional 2-D and 3-D images are performed. These views confirm presence of a spiculated small mass in the retroareolar region of the LEFT breast, best seen on craniocaudal views and  confirmed with rolled spot compression views.   On physical exam, I palpate no abnormality in the UPPER central LEFT breast.   Targeted ultrasound is performed, showing no definitive sonographic correlate for the mass seen mammographically. Evaluation of the LEFT axilla is negative for adenopathy.   IMPRESSION: Small mass in the LEFT breast without definitive sonographic correlate.   No LEFT axillary adenopathy.   RECOMMENDATION: Recommend stereotactic guided core biopsy of the LEFT breast.   I have discussed the findings and recommendations with the patient. Initially, the patient asks if the biopsy can wait until later, as this was her baseline exam. As we discussed, although followup may be appropriate for lesions with a probably benign appearance, biopsy is recommended for this lesion. The patient agrees to schedule biopsy. However, if she cancels the biopsy, I asked her to return in 6 months for follow-up of the LEFT breast. If applicable, a reminder letter will be sent to the patient regarding the next appointment.   BI-RADS  CATEGORY  4: Suspicious.     Electronically Signed   By: Nolon Nations M.D.   On: 03/20/2021 12:26  Assessment    Radial scar left breast.   Patient Active Problem List   Diagnosis Date Noted   Radial scar of left breast 04/12/2021   Encounter for screening colonoscopy    Polyp of colon     Plan    RF ID tag excisional biopsy left breast.  We reviewed the fact that up to 14-17% may be upstaged with excisional biopsy.  And that excisional biopsy may not reveal any additional pathology as well.  She accepts the risks and desires to proceed with biopsy.  We discussed the additional risks including anesthesia, bleeding, infection, the addition of additional scarring that we will continue to follow under future imaging.  Options of deferring excisional biopsy include serial imaging, she would like to diminish the frequency of imaging.  So she would like to proceed with surgery.  Face-to-face time spent with the patient and accompanying care providers(if present) was 40 minutes, with more than 50% of the time spent counseling, educating, and coordinating care of the patient.    These notes generated with voice recognition software. I apologize for typographical errors.  Ronny Bacon M.D., FACS 04/12/2021, 12:31 PM

## 2021-04-12 NOTE — Patient Instructions (Addendum)
Our surgery scheduler Pamala Hurry will call you within 24-48 hours to get you scheduled. If you have not heard from her after 48 hours, please call our office. You will not need to get Covid tested before surgery and have the blue sheet available when she calls to write down important information.    If you have any concerns or questions, please feel free to call our office.    Lumpectomy  A lumpectomy, sometimes called a partial mastectomy, is surgery to remove a cancerous tumor or mass (the lump) from a breast. It is a form of breast-conserving or breast-preservation surgery. This means that the canceroustissue is removed but the breast remains intact. During a lumpectomy, the portion of the breast that contains the tumor is removed. Some normal tissue around the lump may be taken out to make sure that all of the tumor has been removed. Lymph nodes under your arm may also be removed and tested to find out if the cancer has spread. Lymph nodes are part of the body's disease-fighting system (immune system) and are usually the first place where breast cancer spreads. Tell a health care provider about: Any allergies you have. All medicines you are taking, including vitamins, herbs, eye drops, creams, and over-the-counter medicines. Any problems you or family members have had with anesthetic medicines. Any blood disorders you have. Any surgeries you have had. Any medical conditions you have. Whether you are pregnant or may be pregnant. What are the risks? Generally, this is a safe procedure. However, problems may occur, including: Bleeding. Infection. Allergic reaction to medicines. Pain, swelling, weakness, or numbness in the arm on the side of your surgery. Temporary swelling. Change in the shape of the breast, particularly if a large portion is removed. Scar tissue that forms at the surgical site and feels hard to the touch. Blood clots. What happens before the procedure? Staying  hydrated Follow instructions from your health care provider about hydration, which may include: Up to 2 hours before the procedure - you may continue to drink clear liquids, such as water, clear fruit juice, black coffee, and plain tea.  Eating and drinking restrictions Follow instructions from your health care provider about eating and drinking, which may include: 8 hours before the procedure - stop eating heavy meals or foods, such as meat, fried foods, or fatty foods. 6 hours before the procedure - stop eating light meals or foods, such as toast or cereal. 6 hours before the procedure - stop drinking milk or drinks that contain milk. 2 hours before the procedure - stop drinking clear liquids. Medicines Ask your health care provider about: Changing or stopping your regular medicines. This is especially important if you are taking diabetes medicines or blood thinners. Taking medicines such as aspirin and ibuprofen. These medicines can thin your blood. Do not take these medicines unless your health care provider tells you to take them. Taking over-the-counter medicines, vitamins, herbs, and supplements. General instructions Prior to surgery, your health care provider may do a procedure to locate and mark the tumor area in your breast (localization). This will help guide your surgeon to where the incision will be made. This may be done with: Imaging, such as a mammogram, ultrasound, or MRI. Insertion of a small wire, clip, or seed, or an implant that will reflect a radar signal. You may have screening tests or exams to get baseline measurements of your arm. These can be compared to measurements done after surgery to monitor for swelling (lymphedema) that can  develop after having lymph nodes removed. Ask your health care provider: How your surgery site will be marked. What steps will be taken to help prevent infection. These may include: Washing skin with a germ-killing soap. Taking antibiotic  medicine. Plan to have someone take you home from the hospital or clinic. Plan to have a responsible adult care for you for at least 24 hours after you leave the hospital or clinic. This is important. What happens during the procedure?  An IV will be inserted into one of your veins. You will be given one or more of the following: A medicine to help you relax (sedative). A medicine to numb the area (local anesthetic). A medicine to make you fall asleep (general anesthetic). Your health care provider will use a kind of electric scalpel that uses heat to reduce bleeding (electrocautery knife). A curved incision that follows the natural curve of your breast will be made. This type of incision will allow for minimal scarring and better healing. The tumor will be removed along with some of the tissue around it. This will be sent to the lab for testing. Your health care provider may also remove lymph nodes at this time if needed. If the tumor is close to the muscles over your chest, some muscle tissue may also be removed. A small drain tube may be inserted into your breast area or armpit to collect fluid that may build up after surgery. This tube will be connected to a suction bulb on the outside of your body to remove the fluid. The incision will be closed with stitches (sutures). A bandage (dressing) may be placed over the incision. The procedure may vary among health care providers and hospitals. What happens after the procedure? Your blood pressure, heart rate, breathing rate, and blood oxygen level will be monitored until you leave the hospital or clinic. You will be given medicine for pain as needed. Your IV will be removed when you are able to eat and drink by mouth. You will be encouraged to get up and walk as soon as you can. This is important to improve blood flow and breathing. Ask for help if you feel weak or unsteady. You may have: A drain tube in place for 2-3 days to prevent a  collection of blood (hematoma) from developing in the breast. You will be given instructions about caring for the drain before you go home. A pressure bandage applied for 1-2 days to prevent bleeding or swelling. Your pressure bandage may look like a thick piece of fabric or an elastic wrap. Ask your health care provider how to care for your bandage at home. You may be given a tight sleeve to wear over your arm on the side of your surgery. You should wear this sleeve as told by your health care provider. Do not drive for 24 hours if you were given a sedative during your procedure. Summary A lumpectomy, sometimes called a partial mastectomy, is surgery to remove a cancerous tumor or mass (the lump) from a breast. During a lumpectomy, the portion of the breast that contains the tumor is removed. Lymph nodes under your arm may also be removed and tested to find out if the cancer has spread. Plan to have someone take you home from the hospital or clinic. You may have a drain tube in place for 2-3 days to prevent a collection of blood (hematoma) from developing in the breast. You will be given instructions about caring for the drain before you  go home. This information is not intended to replace advice given to you by your health care provider. Make sure you discuss any questions you have with your healthcare provider. Document Revised: 04/19/2019 Document Reviewed: 04/19/2019 Elsevier Patient Education  [place].

## 2021-04-13 ENCOUNTER — Other Ambulatory Visit: Payer: Self-pay

## 2021-04-13 DIAGNOSIS — H8110 Benign paroxysmal vertigo, unspecified ear: Secondary | ICD-10-CM

## 2021-04-13 NOTE — Progress Notes (Signed)
Ref placed to ENT

## 2021-04-18 ENCOUNTER — Encounter: Payer: 59 | Admitting: Family Medicine

## 2021-05-09 ENCOUNTER — Ambulatory Visit
Admission: RE | Admit: 2021-05-09 | Discharge: 2021-05-09 | Disposition: A | Discharge status: Home / Self Care | Payer: 59 | Source: Ambulatory Visit | Attending: Surgery | Admitting: Surgery

## 2021-05-09 ENCOUNTER — Other Ambulatory Visit: Payer: Self-pay

## 2021-05-09 DIAGNOSIS — N6489 Other specified disorders of breast: Secondary | ICD-10-CM | POA: Diagnosis not present

## 2021-05-09 DIAGNOSIS — R928 Other abnormal and inconclusive findings on diagnostic imaging of breast: Secondary | ICD-10-CM | POA: Diagnosis not present

## 2021-05-18 ENCOUNTER — Encounter
Admission: RE | Admit: 2021-05-18 | Discharge: 2021-05-18 | Disposition: A | Discharge status: Home / Self Care | Payer: 59 | Source: Ambulatory Visit | Attending: Surgery | Admitting: Surgery

## 2021-05-18 ENCOUNTER — Other Ambulatory Visit: Payer: Self-pay

## 2021-05-18 NOTE — Patient Instructions (Signed)
Your procedure is scheduled on: 05/25/21 Report to [facility]. To find out your arrival time please call [phone] between 1PM - 3PM on 05/24/21.  Remember: Instructions that are not followed completely may result in serious medical risk, up to and including death, or upon the discretion of your surgeon and anesthesiologist your surgery may need to be rescheduled.     _X__ 1. Do not eat food or drink any liquids after midnight the night before your procedure.                 No gum chewing or hard candies.   __X__2.  On the morning of surgery brush your teeth with toothpaste and water, you                 may rinse your mouth with mouthwash if you wish.  Do not swallow any              toothpaste of mouthwash.     _X__ 3.  No Alcohol for 24 hours before or after surgery.   _X__ 4.  Do Not Smoke or use e-cigarettes For 24 Hours Prior to Your Surgery.                 Do not use any chewable tobacco products for at least 6 hours prior to                 surgery.  ____  5.  Bring all medications with you on the day of surgery if instructed.   __X__  6.  Notify your doctor if there is any change in your medical condition      (cold, fever, infections).     Do not wear jewelry, make-up, hairpins, clips or nail polish. Do not wear lotions, powders, or perfumes. No deodorant Do not shave 48 hours prior to surgery. Men may shave face and neck. Do not bring valuables to the hospital.    [facility] is not responsible for any belongings or valuables.  Contacts, dentures/partials or body piercings may not be worn into surgery. Bring a case for your contacts, glasses or hearing aids, a denture cup will be supplied. Leave your suitcase in the car. After surgery it may be brought to your room. For patients admitted to the hospital, discharge time is determined by your treatment team.   Patients discharged the day of surgery will not be  allowed to drive home.   Please read over the following fact sheets that you were given:   CHG soap  __X__ Take these medicines the morning of surgery with A SIP OF WATER:    1. Esomeprazole Magnesium 20 MG TBEC or Nexium  2. fexofenadine (ALLEGRA) 180 MG tablet  3.   4.  5.  6.  ____ Fleet Enema (as directed)   __X__ Use CHG Soap/SAGE wipes as directed  ____ Use inhalers on the day of surgery  ____ Stop metformin/Janumet/Farxiga 2 days prior to surgery    ____ Take 1/2 of usual insulin dose the night before surgery. No insulin the morning          of surgery.   ____ Stop Blood Thinners Coumadin/Plavix/Xarelto/Pleta/Pradaxa/Eliquis/Effient/Aspirin  on   Or contact your Surgeon, Cardiologist or Medical Doctor regarding  ability to stop your blood thinners  __X__ Stop Anti-inflammatories 7 days before surgery such as Advil, Ibuprofen, Motrin,  BC or Goodies Powder, Naprosyn, Naproxen, Aleve, Aspirin  __X__ Stop all herbal supplements, fish oil or vitamin E until after surgery.    ____ Bring C-Pap to the hospital.

## 2021-05-21 ENCOUNTER — Other Ambulatory Visit: Payer: Self-pay

## 2021-05-21 ENCOUNTER — Encounter
Admission: RE | Admit: 2021-05-21 | Discharge: 2021-05-21 | Disposition: A | Discharge status: Home / Self Care | Payer: 59 | Source: Ambulatory Visit | Attending: Surgery | Admitting: Surgery

## 2021-05-21 DIAGNOSIS — Z01818 Encounter for other preprocedural examination: Secondary | ICD-10-CM | POA: Insufficient documentation

## 2021-05-21 LAB — CBC WITH DIFFERENTIAL/PLATELET
Abs Immature Granulocytes: 0.02 10*3/uL (ref 0.00–0.07)
Basophils Absolute: 0.1 10*3/uL (ref 0.0–0.1)
Basophils Relative: 1 %
Eosinophils Absolute: 0.2 10*3/uL (ref 0.0–0.5)
Eosinophils Relative: 2 %
HCT: 41.9 % (ref 36.0–46.0)
Hemoglobin: 14.3 g/dL (ref 12.0–15.0)
Immature Granulocytes: 0 %
Lymphocytes Relative: 28 %
Lymphs Abs: 1.8 10*3/uL (ref 0.7–4.0)
MCH: 33.7 pg (ref 26.0–34.0)
MCHC: 34.1 g/dL (ref 30.0–36.0)
MCV: 98.8 fL (ref 80.0–100.0)
Monocytes Absolute: 0.4 10*3/uL (ref 0.1–1.0)
Monocytes Relative: 6 %
Neutro Abs: 4.2 10*3/uL (ref 1.7–7.7)
Neutrophils Relative %: 63 %
Platelets: 221 10*3/uL (ref 150–400)
RBC: 4.24 MIL/uL (ref 3.87–5.11)
RDW: 11.5 % (ref 11.5–15.5)
WBC: 6.6 10*3/uL (ref 4.0–10.5)
nRBC: 0 % (ref 0.0–0.2)

## 2021-05-21 LAB — COMPREHENSIVE METABOLIC PANEL
ALT: 56 U/L — ABNORMAL HIGH (ref 0–44)
AST: 59 U/L — ABNORMAL HIGH (ref 15–41)
Albumin: 3.6 g/dL (ref 3.5–5.0)
Alkaline Phosphatase: 82 U/L (ref 38–126)
Anion gap: 8 (ref 5–15)
BUN: 10 mg/dL (ref 6–20)
CO2: 24 mmol/L (ref 22–32)
Calcium: 9 mg/dL (ref 8.9–10.3)
Chloride: 106 mmol/L (ref 98–111)
Creatinine, Ser: 0.62 mg/dL (ref 0.44–1.00)
GFR, Estimated: >60 mL/min (ref >60)
Glucose, Bld: 146 mg/dL — ABNORMAL HIGH (ref 70–99)
Potassium: 3.6 mmol/L (ref 3.5–5.1)
Sodium: 138 mmol/L (ref 135–145)
Total Bilirubin: 1.3 mg/dL — ABNORMAL HIGH (ref 0.3–1.2)
Total Protein: 8 g/dL (ref 6.5–8.1)

## 2021-05-25 ENCOUNTER — Encounter: Payer: Self-pay | Admitting: Surgery

## 2021-05-25 ENCOUNTER — Encounter
Admission: RE | Disposition: A | Discharge status: Home / Self Care | Payer: Self-pay | Source: Home / Self Care | Attending: Surgery

## 2021-05-25 ENCOUNTER — Ambulatory Visit
Admission: RE | Admit: 2021-05-25 | Discharge: 2021-05-25 | Disposition: A | Discharge status: Home / Self Care | Payer: 59 | Source: Ambulatory Visit | Attending: Surgery | Admitting: Surgery

## 2021-05-25 ENCOUNTER — Ambulatory Visit
Admission: RE | Admit: 2021-05-25 | Discharge: 2021-05-25 | Disposition: A | Discharge status: Home / Self Care | Payer: 59 | Attending: Surgery | Admitting: Surgery

## 2021-05-25 ENCOUNTER — Ambulatory Visit: Payer: 59 | Admitting: Urgent Care

## 2021-05-25 ENCOUNTER — Other Ambulatory Visit: Payer: Self-pay

## 2021-05-25 DIAGNOSIS — Z791 Long term (current) use of non-steroidal anti-inflammatories (NSAID): Secondary | ICD-10-CM | POA: Insufficient documentation

## 2021-05-25 DIAGNOSIS — Z825 Family history of asthma and other chronic lower respiratory diseases: Secondary | ICD-10-CM | POA: Diagnosis not present

## 2021-05-25 DIAGNOSIS — N6082 Other benign mammary dysplasias of left breast: Secondary | ICD-10-CM

## 2021-05-25 DIAGNOSIS — Z8249 Family history of ischemic heart disease and other diseases of the circulatory system: Secondary | ICD-10-CM | POA: Diagnosis not present

## 2021-05-25 DIAGNOSIS — L905 Scar conditions and fibrosis of skin: Secondary | ICD-10-CM | POA: Diagnosis not present

## 2021-05-25 DIAGNOSIS — Z885 Allergy status to narcotic agent status: Secondary | ICD-10-CM | POA: Insufficient documentation

## 2021-05-25 DIAGNOSIS — R928 Other abnormal and inconclusive findings on diagnostic imaging of breast: Secondary | ICD-10-CM | POA: Diagnosis not present

## 2021-05-25 DIAGNOSIS — Z91048 Other nonmedicinal substance allergy status: Secondary | ICD-10-CM | POA: Diagnosis not present

## 2021-05-25 DIAGNOSIS — E669 Obesity, unspecified: Secondary | ICD-10-CM | POA: Diagnosis not present

## 2021-05-25 DIAGNOSIS — Z888 Allergy status to other drugs, medicaments and biological substances status: Secondary | ICD-10-CM | POA: Insufficient documentation

## 2021-05-25 DIAGNOSIS — N6012 Diffuse cystic mastopathy of left breast: Secondary | ICD-10-CM

## 2021-05-25 DIAGNOSIS — Z79899 Other long term (current) drug therapy: Secondary | ICD-10-CM | POA: Insufficient documentation

## 2021-05-25 DIAGNOSIS — Z87891 Personal history of nicotine dependence: Secondary | ICD-10-CM | POA: Insufficient documentation

## 2021-05-25 DIAGNOSIS — Z6841 Body Mass Index (BMI) 40.0 and over, adult: Secondary | ICD-10-CM | POA: Diagnosis not present

## 2021-05-25 DIAGNOSIS — N6489 Other specified disorders of breast: Secondary | ICD-10-CM

## 2021-05-25 HISTORY — PX: BREAST LUMPECTOMY WITH RADIOFREQUENCY TAG IDENTIFICATION: SHX6884

## 2021-05-25 LAB — POCT PREGNANCY, URINE: Preg Test, Ur: NEGATIVE

## 2021-05-25 SURGERY — BREAST LUMPECTOMY WITH RADIOFREQUENCY TAG IDENTIFICATION
Anesthesia: General | Laterality: Left

## 2021-05-25 MED ORDER — CEFAZOLIN SODIUM-DEXTROSE 2-4 GM/100ML-% IV SOLN
INTRAVENOUS | Status: AC
  Filled 2021-05-25: qty 100

## 2021-05-25 MED ORDER — GABAPENTIN 300 MG PO CAPS
ORAL_CAPSULE | ORAL | Status: AC
  Administered 2021-05-25: 300 mg via ORAL
  Filled 2021-05-25: qty 1

## 2021-05-25 MED ORDER — CEFAZOLIN SODIUM-DEXTROSE 2-4 GM/100ML-% IV SOLN
2.0000 g | INTRAVENOUS | Status: AC
  Administered 2021-05-25: 2 g via INTRAVENOUS

## 2021-05-25 MED ORDER — TRAMADOL HCL 50 MG PO TABS: 50.0000 mg | ORAL_TABLET | Freq: Four times a day (QID) | ORAL | 0 refills | Status: DC | PRN

## 2021-05-25 MED ORDER — TRAMADOL HCL 50 MG PO TABS
ORAL_TABLET | ORAL | Status: AC
  Administered 2021-05-25: 50 mg via ORAL
  Filled 2021-05-25: qty 1

## 2021-05-25 MED ORDER — CHLORHEXIDINE GLUCONATE 0.12 % MT SOLN: 15.0000 mL | Freq: Once | OROMUCOSAL | Status: AC

## 2021-05-25 MED ORDER — DEXMEDETOMIDINE (PRECEDEX) IN NS 20 MCG/5ML (4 MCG/ML) IV SYRINGE
PREFILLED_SYRINGE | INTRAVENOUS | Status: DC | PRN
  Administered 2021-05-25 (×2): 8 ug via INTRAVENOUS
  Administered 2021-05-25: 4 ug via INTRAVENOUS

## 2021-05-25 MED ORDER — TRAMADOL HCL 50 MG PO TABS: 50.0000 mg | ORAL_TABLET | Freq: Once | ORAL | Status: AC

## 2021-05-25 MED ORDER — LIDOCAINE HCL (CARDIAC) PF 100 MG/5ML IV SOSY
PREFILLED_SYRINGE | INTRAVENOUS | Status: DC | PRN
  Administered 2021-05-25: 50 mg via INTRAVENOUS

## 2021-05-25 MED ORDER — ORAL CARE MOUTH RINSE: 15.0000 mL | Freq: Once | OROMUCOSAL | Status: AC

## 2021-05-25 MED ORDER — FENTANYL CITRATE (PF) 100 MCG/2ML IJ SOLN
INTRAMUSCULAR | Status: DC | PRN
  Administered 2021-05-25 (×2): 25 ug via INTRAVENOUS

## 2021-05-25 MED ORDER — CHLORHEXIDINE GLUCONATE CLOTH 2 % EX PADS: 6.0000 | MEDICATED_PAD | Freq: Once | CUTANEOUS | Status: DC

## 2021-05-25 MED ORDER — PROPOFOL 500 MG/50ML IV EMUL
INTRAVENOUS | Status: DC | PRN
  Administered 2021-05-25: 70 ug/kg/min via INTRAVENOUS

## 2021-05-25 MED ORDER — MIDAZOLAM HCL 2 MG/2ML IJ SOLN
INTRAMUSCULAR | Status: AC
  Filled 2021-05-25: qty 2

## 2021-05-25 MED ORDER — KETOROLAC TROMETHAMINE 30 MG/ML IJ SOLN
INTRAMUSCULAR | Status: DC | PRN
  Administered 2021-05-25: 30 mg via INTRAVENOUS

## 2021-05-25 MED ORDER — ONDANSETRON HCL 4 MG/2ML IJ SOLN
INTRAMUSCULAR | Status: DC | PRN
  Administered 2021-05-25: 4 mg via INTRAVENOUS

## 2021-05-25 MED ORDER — FENTANYL CITRATE (PF) 100 MCG/2ML IJ SOLN
INTRAMUSCULAR | Status: AC
  Filled 2021-05-25: qty 2

## 2021-05-25 MED ORDER — FENTANYL CITRATE (PF) 100 MCG/2ML IJ SOLN: 25.0000 ug | INTRAMUSCULAR | Status: DC | PRN

## 2021-05-25 MED ORDER — LACTATED RINGERS IV SOLN: INTRAVENOUS | Status: DC

## 2021-05-25 MED ORDER — ACETAMINOPHEN 10 MG/ML IV SOLN
INTRAVENOUS | Status: DC | PRN
  Administered 2021-05-25: 1000 mg via INTRAVENOUS

## 2021-05-25 MED ORDER — BUPIVACAINE LIPOSOME 1.3 % IJ SUSP
INTRAMUSCULAR | Status: AC
  Filled 2021-05-25: qty 20

## 2021-05-25 MED ORDER — BUPIVACAINE-EPINEPHRINE (PF) 0.25% -1:200000 IJ SOLN
INTRAMUSCULAR | Status: AC
  Filled 2021-05-25: qty 30

## 2021-05-25 MED ORDER — CHLORHEXIDINE GLUCONATE 0.12 % MT SOLN
OROMUCOSAL | Status: AC
  Administered 2021-05-25: 15 mL via OROMUCOSAL
  Filled 2021-05-25: qty 15

## 2021-05-25 MED ORDER — GABAPENTIN 300 MG PO CAPS
300.0000 mg | ORAL_CAPSULE | ORAL | Status: AC
Start: 2021-05-25 — End: 2021-05-25

## 2021-05-25 MED ORDER — ACETAMINOPHEN 10 MG/ML IV SOLN
INTRAVENOUS | Status: AC
  Filled 2021-05-25: qty 100

## 2021-05-25 MED ORDER — HYDROCODONE-ACETAMINOPHEN 7.5-325 MG PO TABS
1.0000 | ORAL_TABLET | Freq: Once | ORAL | Status: DC | PRN
  Filled 2021-05-25: qty 1

## 2021-05-25 MED ORDER — BUPIVACAINE-EPINEPHRINE (PF) 0.25% -1:200000 IJ SOLN
INTRAMUSCULAR | Status: DC | PRN
  Administered 2021-05-25: 30 mL

## 2021-05-25 MED ORDER — KETAMINE HCL 50 MG/ML IJ SOLN
INTRAMUSCULAR | Status: DC | PRN
  Administered 2021-05-25: 50 mg via INTRAMUSCULAR

## 2021-05-25 MED ORDER — BUPIVACAINE LIPOSOME 1.3 % IJ SUSP: 20.0000 mL | Freq: Once | INTRAMUSCULAR | Status: DC

## 2021-05-25 MED ORDER — 0.9 % SODIUM CHLORIDE (POUR BTL) OPTIME
TOPICAL | Status: DC | PRN
  Administered 2021-05-25: 200 mL

## 2021-05-25 MED ORDER — MIDAZOLAM HCL 2 MG/2ML IJ SOLN
INTRAMUSCULAR | Status: DC | PRN
  Administered 2021-05-25: 2 mg via INTRAVENOUS

## 2021-05-25 SURGICAL SUPPLY — 39 items
ADH SKN CLS APL DERMABOND .7 (GAUZE/BANDAGES/DRESSINGS) ×1
APL PRP STRL LF DISP 70% ISPRP (MISCELLANEOUS) ×1
APPLIER CLIP 9.375 SM OPEN (CLIP)
APR CLP SM 9.3 20 MLT OPN (CLIP)
BLADE SURG 15 STRL LF DISP TIS (BLADE) ×1 IMPLANT
BLADE SURG 15 STRL SS (BLADE) ×2
CANISTER SUCT 1200ML W/VALVE (MISCELLANEOUS) IMPLANT
CHLORAPREP W/TINT 26 (MISCELLANEOUS) ×2 IMPLANT
CLIP APPLIE 9.375 SM OPEN (CLIP) IMPLANT
CNTNR SPEC 2.5X3XGRAD LEK (MISCELLANEOUS)
CONT SPEC 4OZ STER OR WHT (MISCELLANEOUS)
CONT SPEC 4OZ STRL OR WHT (MISCELLANEOUS)
CONTAINER SPEC 2.5X3XGRAD LEK (MISCELLANEOUS) IMPLANT
DERMABOND ADVANCED (GAUZE/BANDAGES/DRESSINGS) ×1
DERMABOND ADVANCED .7 DNX12 (GAUZE/BANDAGES/DRESSINGS) ×1 IMPLANT
DEVICE DUBIN SPECIMEN MAMMOGRA (MISCELLANEOUS) ×2 IMPLANT
DRAPE LAPAROTOMY TRNSV 106X77 (MISCELLANEOUS) ×2 IMPLANT
ELECT CAUTERY BLADE TIP 2.5 (TIP) ×2
ELECT REM PT RETURN 9FT ADLT (ELECTROSURGICAL) ×2
ELECTRODE CAUTERY BLDE TIP 2.5 (TIP) ×1 IMPLANT
ELECTRODE REM PT RTRN 9FT ADLT (ELECTROSURGICAL) ×1 IMPLANT
GAUZE 4X4 16PLY ~~LOC~~+RFID DBL (SPONGE) ×2 IMPLANT
GLOVE SURG ORTHO LTX SZ7.5 (GLOVE) ×10 IMPLANT
GOWN STRL REUS W/ TWL LRG LVL3 (GOWN DISPOSABLE) ×2 IMPLANT
GOWN STRL REUS W/TWL LRG LVL3 (GOWN DISPOSABLE) ×4
KIT MARKER MARGIN INK (KITS) IMPLANT
KIT TURNOVER KIT A (KITS) ×2 IMPLANT
MANIFOLD NEPTUNE II (INSTRUMENTS) ×2 IMPLANT
NEEDLE HYPO 22GX1.5 SAFETY (NEEDLE) ×2 IMPLANT
PACK BASIN MINOR ARMC (MISCELLANEOUS) ×2 IMPLANT
SET LOCALIZER 20 PROBE US (MISCELLANEOUS) ×2 IMPLANT
SUT MNCRL 4-0 (SUTURE) ×2
SUT MNCRL 4-0 27XMFL (SUTURE) ×1
SUT VIC AB 3-0 SH 27 (SUTURE) ×2
SUT VIC AB 3-0 SH 27X BRD (SUTURE) ×1 IMPLANT
SUTURE MNCRL 4-0 27XMF (SUTURE) ×1 IMPLANT
SYR 20ML LL LF (SYRINGE) ×2 IMPLANT
TRAP NEPTUNE SPECIMEN COLLECT (MISCELLANEOUS) ×2 IMPLANT
WATER STERILE IRR 1000ML POUR (IV SOLUTION) ×2 IMPLANT

## 2021-05-25 NOTE — Transfer of Care (Signed)
Immediate Anesthesia Transfer of Care Note  Patient: Julia Gallegos  Procedure(s) Performed: BREAST LUMPECTOMY WITH RADIOFREQUENCY TAG IDENTIFICATION (Left)  Patient Location: PACU  Anesthesia Type:General  Level of Consciousness: awake  Airway & Oxygen Therapy: Patient Spontanous Breathing and Patient connected to face mask oxygen  Post-op Assessment: Report given to RN and Post -op Vital signs reviewed and stable  Post vital signs: Reviewed and stable  Last Vitals:  Vitals Value Taken Time  BP 97/67 05/25/21 1025  Temp    Pulse 83 05/25/21 1027  Resp 17 05/25/21 1027  SpO2 97 % 05/25/21 1027  Vitals shown include unvalidated device data.  Last Pain:  Vitals:   05/25/21 0819  TempSrc: Tympanic  PainSc: 0-No pain      Patients Stated Pain Goal: 0 ([id number])  Complications: No notable events documented.

## 2021-05-25 NOTE — Interval H&P Note (Signed)
History and Physical Interval Note:  05/25/2021 9:13 AM  Julia Gallegos  has presented today for surgery, with the diagnosis of radial scar left breast, abnormal left mammogram.  The various methods of treatment have been discussed with the patient and family. After consideration of risks, benefits and other options for treatment, the patient has consented to  Procedure(s): BREAST LUMPECTOMY WITH RADIOFREQUENCY TAG IDENTIFICATION (Left) as a surgical intervention.  The patient's history has been reviewed, patient examined, no change in status, stable for surgery.  I have reviewed the patient's chart and labs.  Questions were answered to the patient's satisfaction.     Ronny Bacon

## 2021-05-25 NOTE — Op Note (Signed)
RF ID tagged/localized left breast biopsy, excisional.  Pre-operative Diagnosis: Radial scar, left breast    Post-operative Diagnosis: Same  Surgeon: Ronny Bacon, M.D., FACS  Anesthesia: MAC  Procedure: Left breast excisional biopsy, RFID tag directed.  Procedure Details  The patient was seen again in the Holding Room. The benefits, complications, treatment options, and expected outcomes were discussed with the patient. The risks of bleeding, infection, recurrence of symptoms, failure to resolve symptoms, hematoma, seroma, open wound, cosmetic deformity, and the need for further surgery were discussed.  The patient was taken to Operating Room, identified as Julia Gallegos and the procedure verified.  A Time Out was held and the above information confirmed.  Prior to the induction of general anesthesia, antibiotic prophylaxis was administered. VTE prophylaxis was in place. The patient was positioned in the supine position. Appropriate anesthesia was then administered and tolerated well. The LOCALizer is used to mark the skin for incision.  Left breast was prepped and draped in usual sterile fashion.  A timeout is completed. Local anesthesia of quarter percent Marcaine with epinephrine was utilized to the site of the surgery. Attention was turned to the RFID tag localization site where an incision was made. Dissection using the LOCALizer to perform a lumpectomy with adequate margins was performed. This was done with electrocautery and sharp dissection with Mayo scissors. There was minimal bleeding, and the cavity packed.  The specimen was placed in the container for radiologic evaluation.  I returned to the cavity to remove the packing, and hemostasis was confirmed with electrocautery.   Irrigated the cavity.  Assuring that hemostasis was adequate and checked multiple times the wound was closed with interrupted 3-0 Vicryl followed by 4-0 subcuticular Monocryl sutures. Dermabond is utilized to  seal the incision.  Additional local was infiltrated into the biopsy cavity and adjacent tissues.  Findings: Faxitron imaging: Confirmed the presence of the RF ID tag, which had been displaced during surgery.  The previous radiographic marker was clearly within the specimen and had not been disturbed by the procedure.  Estimated Blood Loss: Minimal         Drains: None         Specimens: Upper left breast tissue.       Complications: None.         Condition: Stable   Ronny Bacon, M.D., [facility]  05/25/2021 ; 10:33 AM

## 2021-05-25 NOTE — Anesthesia Postprocedure Evaluation (Signed)
Anesthesia Post Note  Patient: CHAVA AXT  Procedure(s) Performed: BREAST LUMPECTOMY WITH RADIOFREQUENCY TAG IDENTIFICATION (Left)  Patient location during evaluation: PACU Anesthesia Type: General Level of consciousness: awake and alert Pain management: pain level controlled Vital Signs Assessment: post-procedure vital signs reviewed and stable Respiratory status: spontaneous breathing, nonlabored ventilation, respiratory function stable and patient connected to nasal cannula oxygen Cardiovascular status: blood pressure returned to baseline and stable Postop Assessment: no apparent nausea or vomiting Anesthetic complications: no   No notable events documented.   Last Vitals:  Vitals:   05/25/21 1030 05/25/21 1045  BP: (!) 97/52 (!) 102/53  Pulse: 81 65  Resp: 16 16  Temp:    SpO2: 95% 100%    Last Pain:  Vitals:   05/25/21 1045  TempSrc:   PainSc: 2                  Margaree Mackintosh

## 2021-05-25 NOTE — H&P (Signed)
Chief Complaint: Radial scar left breast   History of Present Illness Julia Gallegos is a 55 y.o. female with mammographically detected lesion, not appreciated on ultrasound.  Stereotactic biopsy reveals a radial scar without atypia associated.  She is referred for excisional biopsy. She has an implanted birth control device which may eventually be removed so she can go through menopause.  She has no family history of breast cancer.  She had 2 pregnancies delivering her first child the age of 89.  She did breast-feed.  Despite normal monthly breast exams she has no breast changes to speak of.   Past Medical History     Past Medical History:  Diagnosis Date   Allergy     GERD (gastroesophageal reflux disease)     Osteoarthritis               Past Surgical History:  Procedure Laterality Date   BREAST BIOPSY Left 04/03/2021    affirm bx, ribbon marker, radial scar   COLONOSCOPY WITH PROPOFOL N/A 06/09/2020    Procedure: COLONOSCOPY WITH PROPOFOL;  Surgeon: Virgel Manifold, MD;  Location: [facility] ENDOSCOPY;  Service: Endoscopy;  Laterality: N/A;   NO PAST SURGERIES               Allergies  Allergen Reactions   Naproxen Nausea Only      Other reaction(s): Dizziness   Skin Adhesives [Cyanoacrylate] Other (See Comments)      Bandaids cause skin to blister   Vicodin [Hydrocodone-Acetaminophen] Itching            Current Outpatient Medications  Medication Sig Dispense Refill   esomeprazole (NEXIUM) 40 MG capsule Take 40 mg by mouth daily at 12 noon.       etonogestrel (NEXPLANON) 68 MG IMPL implant 1 each (68 mg total) by Subdermal route once for 1 dose. 1 each 0   fexofenadine (ALLEGRA) 180 MG tablet TAKE 1 TABLET BY MOUTH EVERY DAY 90 tablet 0   ibuprofen (ADVIL) 200 MG tablet Take by mouth.       meclizine (ANTIVERT) 25 MG tablet Take 1 tablet (25 mg total) by mouth 3 (three) times daily as needed for dizziness. 30 tablet 0    No current facility-administered medications for  this visit.      Family History      Family History  Problem Relation Age of Onset   Pneumonia Mother 76   COPD Father     Heart disease Maternal Grandmother     Cancer Maternal Grandfather     Heart disease Paternal Grandmother     Breast cancer Neg Hx          Social History Social History         Tobacco Use   Smoking status: Former      Pack years: 0.00      Types: Cigarettes      Quit date: 08/27/2016      Years since quitting: 4.6   Smokeless tobacco: Never  Vaping Use   Vaping Use: Never used  Substance Use Topics   Alcohol use: Yes      Comment: occasionally   Drug use: Never          Review of Systems Constitutional: Negative.   HENT: Negative.    Eyes:  Positive for blurred vision. Respiratory: Negative.    Cardiovascular: Negative.   Gastrointestinal: Negative.   Genitourinary: Negative.   Skin:  Positive for rash ("Heat allergy"). Neurological:  Positive for dizziness.  Psychiatric/Behavioral: Negative.        Physical Exam Blood pressure 137/80, pulse (!) 102, temperature 98.2 F (36.8 C), temperature source Oral, height 5' (1.524 m), weight 246 lb 12.8 oz (111.9 kg), SpO2 99 %.    CONSTITUTIONAL: Well developed, and nourished, appropriately responsive and aware without distress.  Morbidly obese. EYES: Sclera non-icteric.   EARS, NOSE, MOUTH AND THROAT: Mask worn.   Hearing is intact to voice. NECK: Trachea is midline, and there is no jugular venous distension. LYMPH NODES:  Lymph nodes in the neck are not appreciable. RESPIRATORY:  Lungs are clear, and breath sounds are equal bilaterally. Normal respiratory effort without pathologic use of accessory muscles. CARDIOVASCULAR: Heart is regular in rate and rhythm. GI: The abdomen is soft, nontender, and nondistended. There were no palpable masses. I did not appreciate hepatosplenomegaly. There were normal bowel sounds. GU: Breast exam: Levada Dy present as chaperone.  Postbiopsy ecchymosis noted.   No appreciable dominant or suspicious masses. MUSCULOSKELETAL:  Symmetrical muscle tone appreciated in all four extremities.    SKIN: Skin turgor is normal. No pathologic skin lesions appreciated. NEUROLOGIC:  Motor and sensation appear grossly normal.  Cranial nerves are grossly without defect. PSYCH:  Alert and oriented to person, place and time. Affect is appropriate for situation.   Data Reviewed I have personally reviewed what is currently available of the patient's imaging, recent labs and medical records.    SURGICAL PATHOLOGY  CASE: [id number]  PATIENT: [facility]  Surgical Pathology Report  Specimen Submitted:  A. Breast, left   Clinical History: Ribbon clip is 4.8 mm inferior to the biopsied mass.   DIAGNOSIS:  A. LEFT BREAST MASS; STEREOTACTIC BIOPSY:  - BENIGN BREAST TISSUE WITH:       - RADIAL SCAR       - FIBROCYSTIC CHANGES       - USUAL DUCTAL HYPERPLASIA  - NEGATIVE FOR ATYPIA AND MALIGNANCY.     Imaging: Radiology review:  CLINICAL DATA:  Evaluate biopsy marker   EXAM: DIAGNOSTIC LEFT MAMMOGRAM POST STEREOTACTIC BIOPSY   COMPARISON:  Previous   FINDINGS: Mammographic images were obtained following stereotactic guided biopsy of a left breast mass. The ribbon shaped biopsy marker is in close proximity to the biopsied mass. The marker appears to be 4.8 mm inferior to the biopsied mass.   IMPRESSION: The ribbon shaped clip is in close proximity to the biopsied mass. There is air in and around the biopsied mass. The clip is 4.8 mm inferior to the biopsied mass.   Final Assessment: Post Procedure Mammograms for Marker Placement   CLINICAL DATA:  Patient returns after screening study for evaluation of possible LEFT breast asymmetry.   EXAM: DIGITAL DIAGNOSTIC UNILATERAL LEFT MAMMOGRAM WITH TOMOSYNTHESIS AND CAD; ULTRASOUND LEFT BREAST LIMITED   TECHNIQUE: Left digital diagnostic mammography and breast tomosynthesis was performed. The images  were evaluated with computer-aided detection.; Targeted ultrasound examination of the left breast was performed   COMPARISON:  Previous exam(s).   ACR Breast Density Category b: There are scattered areas of fibroglandular density.   FINDINGS: Additional 2-D and 3-D images are performed. These views confirm presence of a spiculated small mass in the retroareolar region of the LEFT breast, best seen on craniocaudal views and confirmed with rolled spot compression views.   On physical exam, I palpate no abnormality in the UPPER central LEFT breast.   Targeted ultrasound is performed, showing no definitive sonographic correlate for the mass seen mammographically. Evaluation of the LEFT axilla is  negative for adenopathy.   IMPRESSION: Small mass in the LEFT breast without definitive sonographic correlate.   No LEFT axillary adenopathy.   RECOMMENDATION: Recommend stereotactic guided core biopsy of the LEFT breast.   I have discussed the findings and recommendations with the patient. Initially, the patient asks if the biopsy can wait until later, as this was her baseline exam. As we discussed, although followup may be appropriate for lesions with a probably benign appearance, biopsy is recommended for this lesion. The patient agrees to schedule biopsy. However, if she cancels the biopsy, I asked her to return in 6 months for follow-up of the LEFT breast. If applicable, a reminder letter will be sent to the patient regarding the next appointment.   BI-RADS CATEGORY  4: Suspicious.     Electronically Signed   By: Nolon Nations M.D.   On: 03/20/2021 12:26   Assessment   Assessment    Radial scar left breast.       Patient Active Problem List    Diagnosis Date Noted   Radial scar of left breast 04/12/2021   Encounter for screening colonoscopy     Polyp of colon        Plan     Plan    RF ID tag excisional biopsy left breast.   We reviewed the fact that up to  14-17% may be upstaged with excisional biopsy.  And that excisional biopsy may not reveal any additional pathology as well.  She accepts the risks and desires to proceed with biopsy.  We discussed the additional risks including anesthesia, bleeding, infection, the addition of additional scarring that we will continue to follow under future imaging.  Options of deferring excisional biopsy include serial imaging, she would like to diminish the frequency of imaging.  So she would like to proceed with surgery.

## 2021-05-25 NOTE — Discharge Instructions (Signed)
AMBULATORY SURGERY  ?DISCHARGE INSTRUCTIONS ? ? ?The drugs that you were given will stay in your system until tomorrow so for the next 24 hours you should not: ? ?Drive an automobile ?Make any legal decisions ?Drink any alcoholic beverage ? ? ?You may resume regular meals tomorrow.  Today it is better to start with liquids and gradually work up to solid foods. ? ?You may eat anything you prefer, but it is better to start with liquids, then soup and crackers, and gradually work up to solid foods. ? ? ?Please notify your doctor immediately if you have any unusual bleeding, trouble breathing, redness and pain at the surgery site, drainage, fever, or pain not relieved by medication. ? ? ? ?Additional Instructions: ? ? ? ?Please contact your physician with any problems or Same Day Surgery at [phone], Monday through Friday 6 am to 4 pm, or [place] [facility] [place] Main number at [phone].  ?

## 2021-05-25 NOTE — Anesthesia Preprocedure Evaluation (Signed)
Anesthesia Evaluation  Patient identified by MRN, date of birth, ID band Patient awake    Reviewed: Allergy & Precautions, NPO status , Patient's Chart, lab work & pertinent test results  Airway Mallampati: III  TM Distance: >3 FB Neck ROM: full    Dental  (+) Edentulous Upper   Pulmonary neg pulmonary ROS, former smoker,    Pulmonary exam normal        Cardiovascular negative cardio ROS Normal cardiovascular exam     Neuro/Psych negative neurological ROS  negative psych ROS   GI/Hepatic Neg liver ROS, GERD  Medicated,  Endo/Other  negative endocrine ROS  Renal/GU negative Renal ROS  negative genitourinary   Musculoskeletal   Abdominal (+) + obese,   Peds  Hematology negative hematology ROS (+)   Anesthesia Other Findings Past Medical History: No date: Allergy No date: GERD (gastroesophageal reflux disease) No date: Osteoarthritis  Past Surgical History: 04/03/2021: BREAST BIOPSY; Left     Comment:  affirm bx, ribbon marker, radial scar 06/09/2020: COLONOSCOPY WITH PROPOFOL; N/A     Comment:  Procedure: COLONOSCOPY WITH PROPOFOL;  Surgeon:               Virgel Manifold, MD;  Location: [facility] ENDOSCOPY;                Service: Endoscopy;  Laterality: N/A; No date: WISDOM TOOTH EXTRACTION  BMI    Body Mass Index: 50.98 kg/m      Reproductive/Obstetrics negative OB ROS                             Anesthesia Physical Anesthesia Plan  ASA: 2  Anesthesia Plan: General   Post-op Pain Management:    Induction: Intravenous  PONV Risk Score and Plan: Propofol infusion and TIVA  Airway Management Planned: Natural Airway and Nasal Cannula  Additional Equipment:   Intra-op Plan:   Post-operative Plan:   Informed Consent: I have reviewed the patients History and Physical, chart, labs and discussed the procedure including the risks, benefits and alternatives for the  proposed anesthesia with the patient or authorized representative who has indicated his/her understanding and acceptance.     Dental Advisory Given  Plan Discussed with: Anesthesiologist, CRNA and Surgeon  Anesthesia Plan Comments: (Patient consented for risks of anesthesia including but not limited to:  - adverse reactions to medications - risk of airway placement if required - damage to eyes, teeth, lips or other oral mucosa - nerve damage due to positioning  - sore throat or hoarseness - Damage to heart, brain, nerves, lungs, other parts of body or loss of life  Patient voiced understanding.)        Anesthesia Quick Evaluation

## 2021-05-28 LAB — SURGICAL PATHOLOGY

## 2021-06-05 ENCOUNTER — Other Ambulatory Visit: Payer: Self-pay

## 2021-06-05 ENCOUNTER — Ambulatory Visit (INDEPENDENT_AMBULATORY_CARE_PROVIDER_SITE_OTHER): Payer: 59 | Admitting: Surgery

## 2021-06-05 ENCOUNTER — Encounter: Payer: Self-pay | Admitting: Surgery

## 2021-06-05 VITALS — BP 139/80 | HR 79 | Temp 97.9°F | Ht 61.0 in | Wt 244.8 lb

## 2021-06-05 DIAGNOSIS — N6489 Other specified disorders of breast: Secondary | ICD-10-CM

## 2021-06-05 NOTE — Progress Notes (Signed)
[facility] POST-OP OFFICE VISIT  06/05/2021  HPI: Julia Gallegos is a 55 y.o. female 8 days s/p left breast biopsy for radial scar.  Vital signs: BP 139/80   Pulse 79   Temp 97.9 F (36.6 C) (Oral)   Ht '5\' 1"'$  (1.549 m)   Wt 244 lb 12.8 oz (111 kg)   LMP  (LMP Unknown)   SpO2 98%   BMI 46.25 kg/m    Physical Exam: Constitutional: She appears well Upper left breast shows some resolving ecchymosis with minimal mass-effect of the biopsy. Skin: Clean, dry and intact, incision appears to be healing well.  SURGICAL PATHOLOGY  CASE: [phone]  PATIENT: [facility]  Surgical Pathology Report   Specimen Submitted:  A. Breast, left upper   Clinical History: Radial scar left breast, abnormal left mammogram.   DIAGNOSIS:  A. LEFT BREAST; LUMPECTOMY:  - BENIGN BREAST TISSUE WITH:       - FIBROCYSTIC CHANGES WITH APOCRINE METAPLASIA       - COLUMNAR CELL CHANGE       - PRIOR BIOPSY SITE CHANGES  - NEGATIVE FOR ATYPIA AND MALIGNANCY.   Comment:  Of note, the biopsy cavity appears transected at the tissue edge.   GROSS DESCRIPTION:  A. Labeled: Upper left breast tissue  Received: Fresh  Specimen radiograph image(s) available for review  Radiographic findings: A ribbon shaped clip and RF ID tag are present.  Time in fixative: Collected at 9:54 AM on 05/25/2021 and placed in  formalin at 10:05 AM on 05/25/2021  Cold ischemic time: Less than 15 minutes  Total fixation time: Approximately 9.75 hours  Type of procedure: Breast lumpectomy  Location / laterality of specimen: Left breast, upper  Orientation of specimen: The specimen is received unoriented.  Inking: Black  Size of specimen: 7.1 x 4.7 x 1.8 cm  Skin: None grossly appreciated.   Biopsy site: There is a biopsy site which contains a ribbon shaped clip.   Number of discrete masses: No distinct masses are identified.  Margins: The clip is focally located less than 0.1 cm to the closest  inked  margin.  Description of remainder of tissue: The RF ID tag is loosely located  between folds within the specimen.  Sectioning reveals yellow lobulated  adipose tissue admixed with a scant amount of fibrous tissue.  The fat  to fibrous tissue ratio is 95.5.   Block summary (the specimen is submitted entirely):  1 - resection margin A, perpendicularly sectioned  2 - 27 - central sections, submitted entirely and sequentially       4 - clip site  28 - 30 - resection margin B, perpendicularly sectioned   RB 05/25/2021    Final Diagnosis performed by Betsy Pries, MD.   Electronically signed  05/28/2021 3:22:54PM  The electronic signature indicates that the named Attending Pathologist  has evaluated the specimen   Assessment/Plan: This is a 55 y.o. female 8 days s/p left breast biopsy, RF ID tag localized.  Patient Active Problem List   Diagnosis Date Noted   Radial scar of left breast 04/12/2021   Morbid obesity ([place]) 04/12/2021   Encounter for screening colonoscopy    Polyp of colon     -Reviewed the meaning of transection of biopsy cavity, and the pathology of her current specimen.  Anticipating close follow-up with repeat imaging in 6 months, and potential rebiopsy should abnormality persist/arise.  I believe she understands the need for continued follow-up, I will see her back  in 6 months.   Ronny Bacon M.D., FACS 06/05/2021, 12:02 PM

## 2021-06-05 NOTE — Patient Instructions (Addendum)
Please call our office with any questions or concerns  We will reach out to you in January 2023 to schedule the left breast mammogram and follow up appointment with Dr. Christian Mate for February.

## 2021-07-08 ENCOUNTER — Other Ambulatory Visit: Payer: Self-pay | Admitting: Family Medicine

## 2021-07-08 DIAGNOSIS — L508 Other urticaria: Secondary | ICD-10-CM

## 2021-07-08 NOTE — Telephone Encounter (Signed)
Requested Prescriptions  Pending Prescriptions Disp Refills  . fexofenadine (ALLEGRA) 180 MG tablet [Pharmacy Med Name: FEXOFENADINE HCL 180 MG TABLET] 90 tablet 0    Sig: TAKE 1 TABLET BY MOUTH EVERY DAY     Ear, Nose, and Throat:  Antihistamines Passed - 07/08/2021  1:26 PM      Passed - Valid encounter within last 12 months    Recent Outpatient Visits          3 months ago Benign paroxysmal positional vertigo, unspecified laterality   [facility] Juline Patch, MD   4 months ago Mixed hyperlipidemia   [facility] Juline Patch, MD   1 year ago Establishing care with new doctor, encounter for   [facility] Juline Patch, MD

## 2021-10-04 ENCOUNTER — Other Ambulatory Visit: Payer: Self-pay | Admitting: Family Medicine

## 2021-10-04 DIAGNOSIS — L508 Other urticaria: Secondary | ICD-10-CM

## 2021-10-04 NOTE — Telephone Encounter (Signed)
Requested Prescriptions  Pending Prescriptions Disp Refills  . fexofenadine (ALLEGRA) 180 MG tablet [Pharmacy Med Name: FEXOFENADINE HCL 180 MG TABLET] 90 tablet 0    Sig: TAKE 1 TABLET BY MOUTH EVERY DAY     Ear, Nose, and Throat:  Antihistamines Passed - 10/04/2021  4:01 AM      Passed - Valid encounter within last 12 months    Recent Outpatient Visits          6 months ago Benign paroxysmal positional vertigo, unspecified laterality   [facility] Juline Patch, MD   7 months ago Mixed hyperlipidemia   [facility] Juline Patch, MD   1 year ago Establishing care with new doctor, encounter for   [facility] Juline Patch, MD

## 2021-11-07 ENCOUNTER — Other Ambulatory Visit: Payer: Self-pay

## 2021-11-07 DIAGNOSIS — N6489 Other specified disorders of breast: Secondary | ICD-10-CM

## 2021-11-08 ENCOUNTER — Other Ambulatory Visit: Payer: Self-pay

## 2021-11-08 DIAGNOSIS — N6489 Other specified disorders of breast: Secondary | ICD-10-CM

## 2021-11-28 ENCOUNTER — Ambulatory Visit
Admission: RE | Admit: 2021-11-28 | Discharge: 2021-11-28 | Disposition: A | Discharge status: Home / Self Care | Payer: 59 | Source: Ambulatory Visit | Attending: Surgery | Admitting: Surgery

## 2021-11-28 ENCOUNTER — Other Ambulatory Visit: Payer: Self-pay

## 2021-11-28 DIAGNOSIS — N6489 Other specified disorders of breast: Secondary | ICD-10-CM

## 2021-11-28 DIAGNOSIS — R922 Inconclusive mammogram: Secondary | ICD-10-CM | POA: Diagnosis not present

## 2021-12-06 ENCOUNTER — Encounter: Payer: Self-pay | Admitting: Surgery

## 2021-12-06 ENCOUNTER — Ambulatory Visit: Payer: 59 | Admitting: Surgery

## 2021-12-06 ENCOUNTER — Other Ambulatory Visit: Payer: Self-pay

## 2021-12-06 VITALS — BP 134/78 | HR 88 | Temp 98.1°F | Ht 61.0 in | Wt 240.6 lb

## 2021-12-06 DIAGNOSIS — N6489 Other specified disorders of breast: Secondary | ICD-10-CM

## 2021-12-06 NOTE — Progress Notes (Signed)
Surgical Clinic Progress/Follow-up Note   HPI:  56 y.o. Female presents to clinic for mammogram and clinical breast exam follow-up 6 months follow the last evaluation.  She reports no breast issues whatsoever, no pain no tenderness no skin changes.  She had her 55-month follow-up mammogram and confirm negativity, with follow-up anticipated in annual screening, this May 2023. She originally had a radial scar on biopsy and subsequent fibrocystic change on excisional biopsy.  And may now resume routine screening. Review of Systems:  Constitutional: denies fever/chills  ENT: denies sore throat, hearing problems  Respiratory: denies shortness of breath, wheezing  Cardiovascular: denies chest pain, palpitations  Gastrointestinal: denies abdominal pain, N/V, or diarrhea/and bowel function as per interval history Skin: Denies any other rashes or skin discolorations as per interval history  Vital Signs:  BP 134/78    Pulse 88    Temp 98.1 F (36.7 C) (Oral)    Ht 5\' 1"  (1.549 m)    Wt 240 lb 9.6 oz (109.1 kg)    SpO2 98%    BMI 45.46 kg/m    Physical Exam:  Constitutional:  -- Obese body habitus  -- Awake, alert, and oriented x3  Pulmonary:  -- No crackles -- Equal breath sounds bilaterally -- Breathing non-labored at rest Cardiovascular:  -- S1, S2 present  -- No pericardial rubs  Gastrointestinal:  -- Soft and non-distended, non-tender/with no tenderness to palpation,  GU  --Raquel Sarna is present as chaperone: Breast exam entirely unremarkable, left upper breast scar is well-healed, there is no suspicious nodularity nor density present in either breast, no asymmetry, no dermal changes or nipple areolar changes. Musculoskeletal / Integumentary:  -- Wounds or skin discoloration: None appreciated  -- Extremities: B/L UE and LE FROM, hands and feet warm, no edema   Laboratory studies: Radiology review:  CLINICAL DATA:  Status post interval excision of a LEFT breast radial scar. Surgical excision  sample demonstrate benign results with transsection of the biopsy cavity at the tissue at the tissue edge   EXAM: DIGITAL DIAGNOSTIC UNILATERAL LEFT MAMMOGRAM WITH TOMOSYNTHESIS AND CAD   TECHNIQUE: Left digital diagnostic mammography and breast tomosynthesis was performed. The images were evaluated with computer-aided detection.   COMPARISON:  Previous exam(s).   ACR Breast Density Category b: There are scattered areas of fibroglandular density.   FINDINGS: There is density and architectural distortion within the LEFT breast, consistent with postsurgical changes. These are new in comparison to prior. No suspicious mass, distortion, or microcalcifications are identified to suggest presence of malignancy.   IMPRESSION: New postsurgical changes. No mammographic evidence of malignancy in the LEFT breast.   RECOMMENDATION: Recommend return to annual screening mammography, due May 2023   I have discussed the findings and recommendations with the patient. If applicable, a reminder letter will be sent to the patient regarding the next appointment.   BI-RADS CATEGORY  2: Benign.     Electronically Signed   By: Valentino Saxon M.D.   On: 11/28/2021 11:42 Imaging: No new pertinent imaging available for review   Assessment:  56 y.o. yo Female with a problem list including...  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  Plan:  -I believe her annual breast exam could be completed  by her primary care, she agrees she does not need a Psychologist, sport and exercise to do that.  Obviously we will be happy to assist in any future changes in mammography or other breast imaging.             - return to clinic as needed, instructed to call office if  any questions or concerns  All of the above recommendations were discussed with the patient , and all of patient's questions were answered to her expressed satisfaction.  These notes generated with voice recognition software. I apologize for typographical errors.  Ronny Bacon, MD, FACS [place]: [facility]. Office: [phone]

## 2021-12-06 NOTE — Patient Instructions (Signed)
If you have any concerns or questions, please feel free to call our office   Breast Self-Awareness Breast self-awareness is knowing how your breasts look and feel. Doing breast self-awareness is important. It allows you to catch a breast problem early while it is still small and can be treated. All women should do breast self-awareness, including women who have had breast implants. Tell your doctor if you notice a change in your breasts. What you need: A mirror. A well-lit room. How to do a breast self-exam A breast self-exam is one way to learn what is normal for your breasts and to check for changes. To do a breast self-exam: Look for changes  Take off all the clothes above your waist. Stand in front of a mirror in a room with good lighting. Put your hands on your hips. Push your hands down. Look at your breasts and nipples in the mirror to see if one breast or nipple looks different from the other. Check to see if: The shape of one breast is different. The size of one breast is different. There are wrinkles, dips, and bumps in one breast and not the other. Look at each breast for changes in the skin, such as: Redness. Scaly areas. Look for changes in your nipples, such as: Liquid around the nipples. Bleeding. Dimpling. Redness. A change in where the nipples are. Feel for changes  Lie on your back on the floor. Feel each breast. To do this, follow these steps: Pick a breast to feel. Put the arm closest to that breast above your head. Use your other arm to feel the nipple area of your breast. Feel the area with the pads of your three middle fingers by making small circles with your fingers. For the first circle, press lightly. For the second circle, press harder. For the third circle, press even harder. Keep making circles with your fingers at the different pressures as you move down your breast. Stop when you feel your ribs. Move your fingers a little toward the center of your  body. Start making circles with your fingers again, this time going up until you reach your collarbone. Keep making up-and-down circles until you reach your armpit. Remember to keep using the three pressures. Feel the other breast in the same way. Sit or stand in the tub or shower. With soapy water on your skin, feel each breast the same way you did in step 2 when you were lying on the floor. Write down what you find Writing down what you find can help you remember what to tell your doctor. Write down: What is normal for each breast. Any changes you find in each breast, including: The kind of changes you find. Whether you have pain. Size and location of any lumps. When you last had your menstrual period. General tips Check your breasts every month. If you are breastfeeding, the best time to check your breasts is after you feed your baby or after you use a breast pump. If you get menstrual periods, the best time to check your breasts is 5-7 days after your menstrual period is over. With time, you will become comfortable with the self-exam, and you will begin to know if there are changes in your breasts. Contact a doctor if you: See a change in the shape or size of your breasts or nipples. See a change in the skin of your breast or nipples, such as red or scaly skin. Have fluid coming from your nipples that is  not normal. Find a lump or thick area that was not there before. Have pain in your breasts. Have any concerns about your breast health. Summary Breast self-awareness includes looking for changes in your breasts, as well as feeling for changes within your breasts. Breast self-awareness should be done in front of a mirror in a well-lit room. You should check your breasts every month. If you get menstrual periods, the best time to check your breasts is 5-7 days after your menstrual period is over. Let your doctor know of any changes you see in your breasts, including changes in size,  changes on the skin, pain or tenderness, or fluid from your nipples that is not normal. This information is not intended to replace advice given to you by your health care provider. Make sure you discuss any questions you have with your health care provider. Document Revised: 06/02/2018 Document Reviewed: 06/02/2018 Elsevier Patient Education  [place].

## 2021-12-07 IMAGING — MG MM PLC BREAST LOC DEV 1ST LESION INC MAMMO GUIDE*L*
6 series · 6 of 6 positions shown · non-contrast
Comparison: Previous exam(s)

CLINICAL DATA: 55-year-old female presenting for radiofrequency tag
localization of a radial scar in the left breast.

EXAM:
MAMMOGRAPHIC GUIDED RADIOFREQUENCY DEVICE
LOCALIZATION OF THE LEFT BREAST

[L CC (1 of 3)]
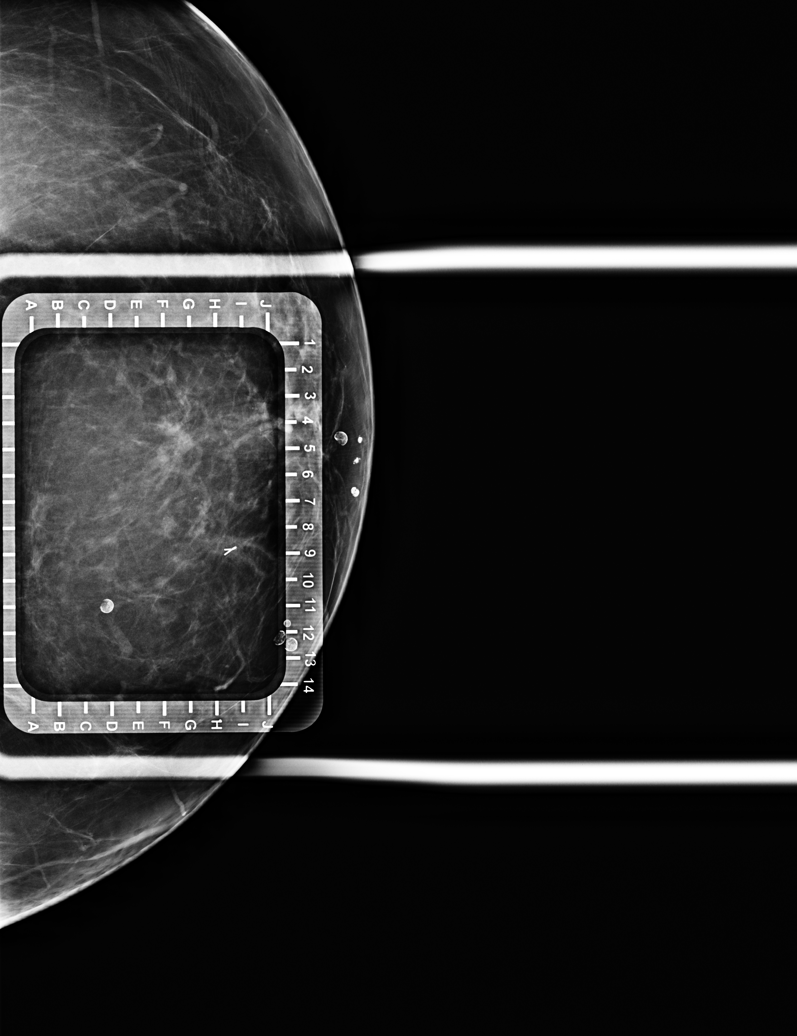

[L ML (1 of 3)]
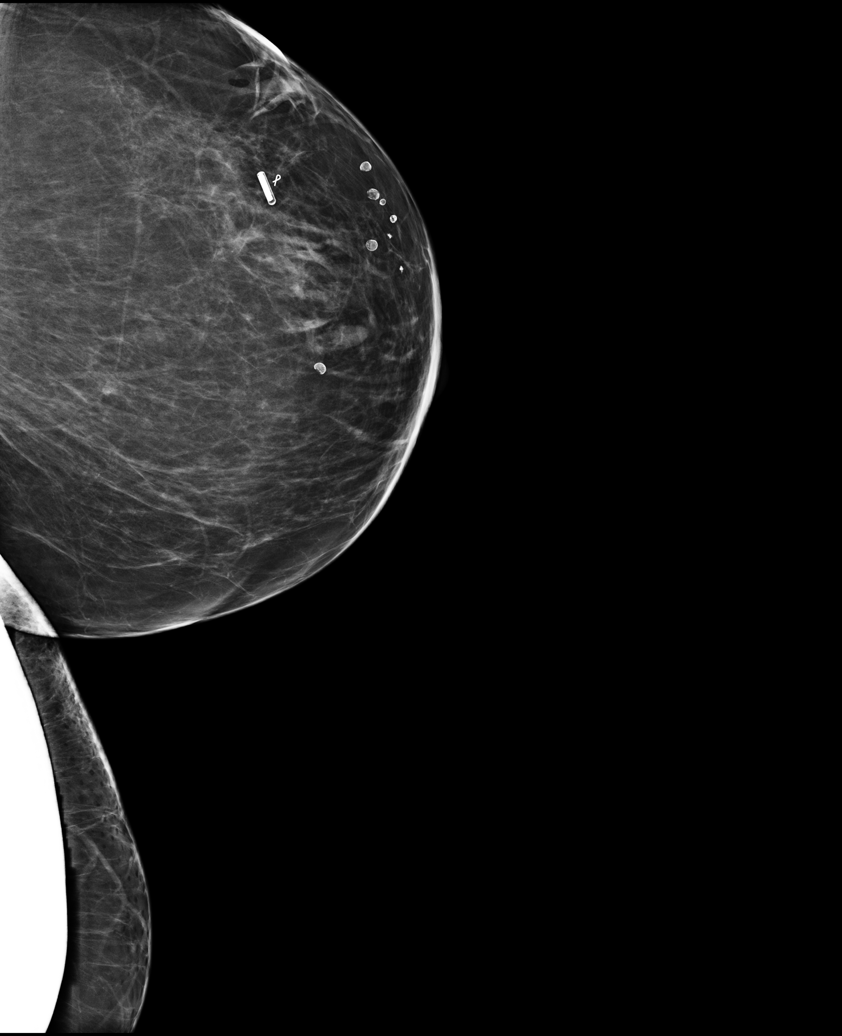

[L CC (2 of 3)]
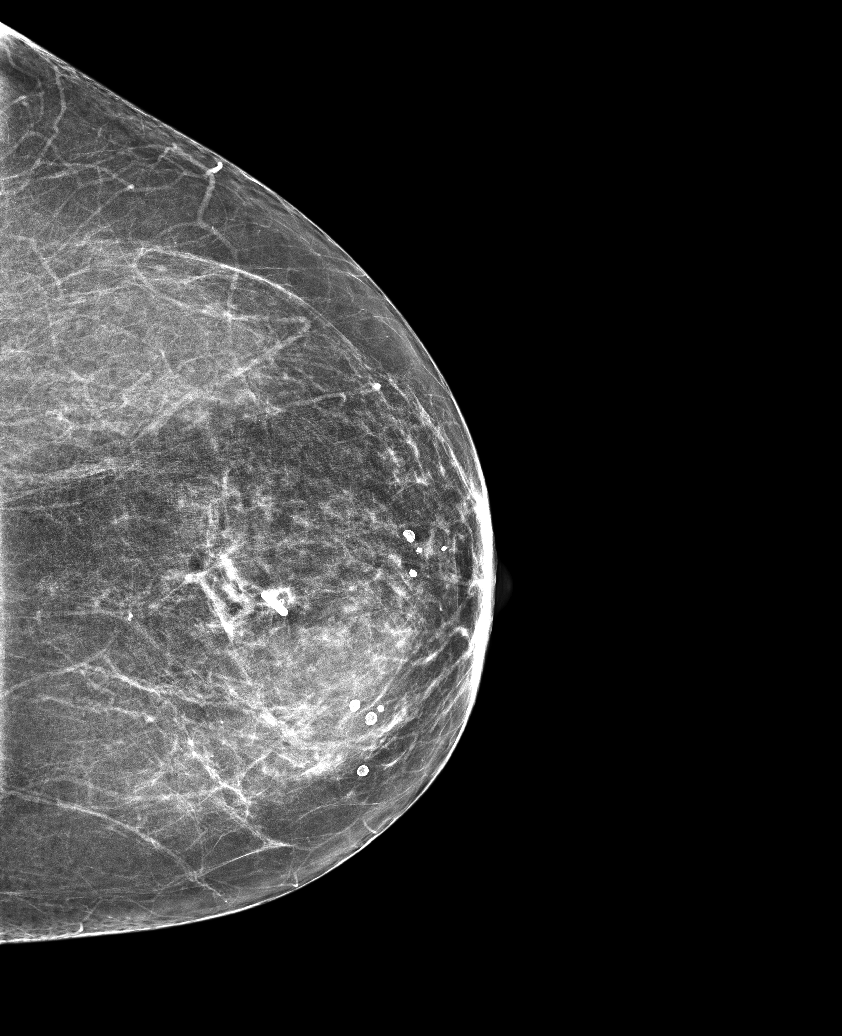

[L CC (3 of 3)]
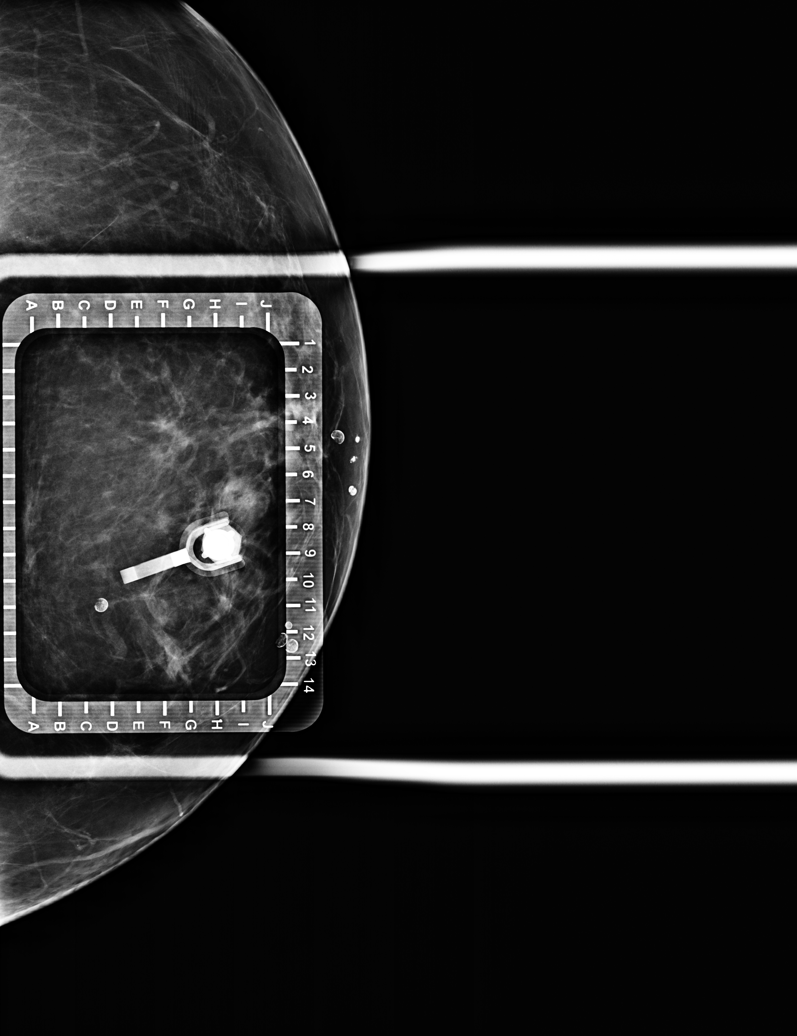

[L ML (2 of 3)]
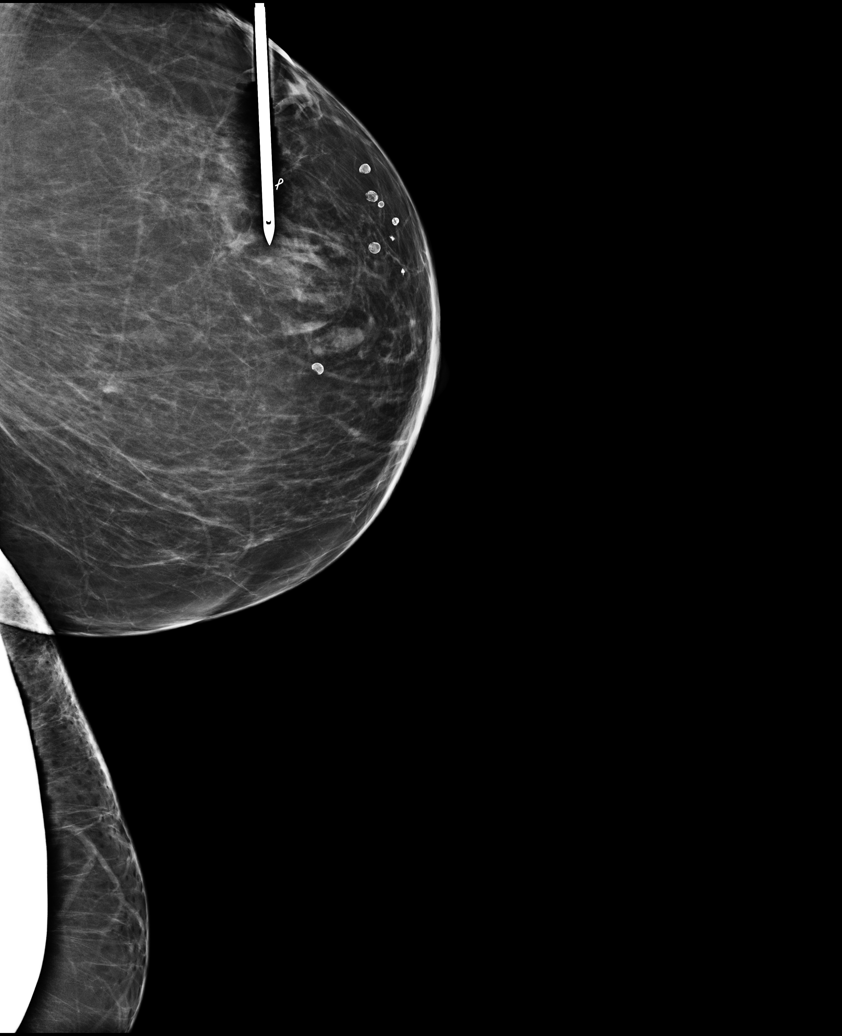

[L ML (3 of 3)]
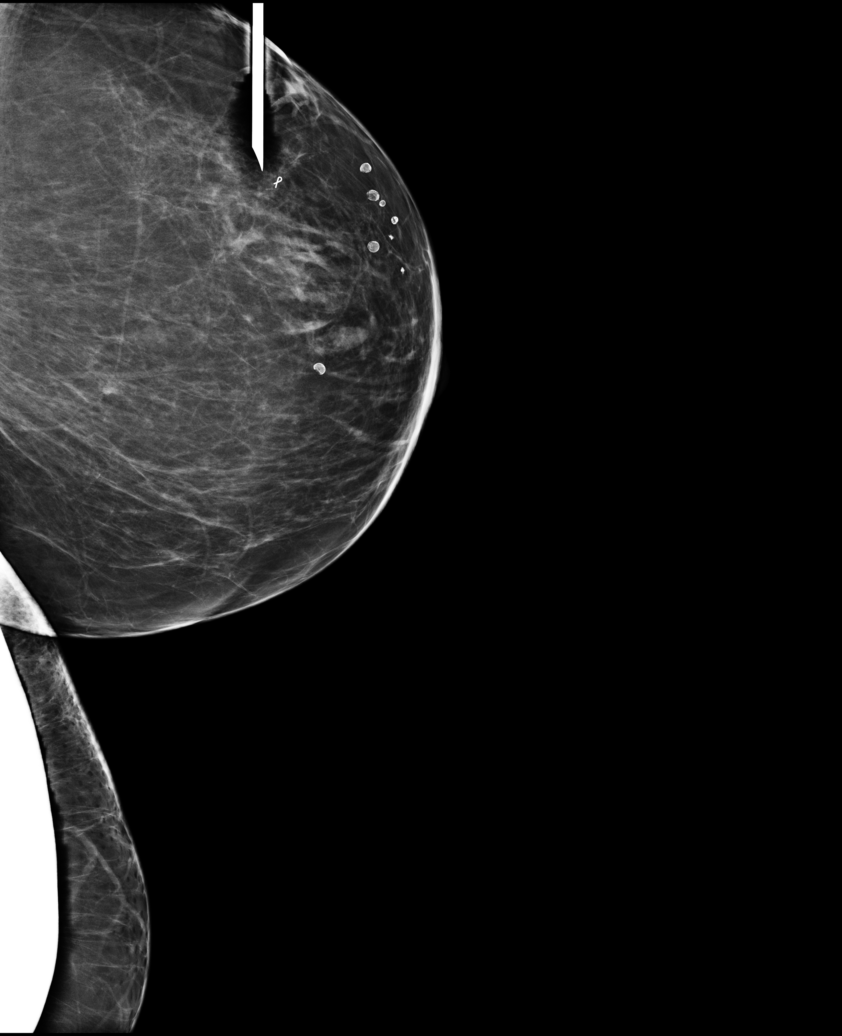

[6 of 6 positions shown; findings below may reference images not displayed]

FINDINGS: Patient presents for radiofrequency device localization prior to
left breast excisional biopsy. I met with the patient and we
discussed the procedure of radiofrequency device localization
including benefits and alternatives. We discussed the high
likelihood of a successful procedure. We discussed the risks of the
procedure including infection, bleeding, tissue injury and further
surgery. Informed, written consent was given.

The usual time-out protocol was performed immediately prior to the
procedure.

Using mammographic guidance, sterile technique, 1% lidocaine as
local anesthesia, a radiofrequency tag was used to localize the
ribbon shaped biopsy marking clip in the superior left breast using
a superior approach.

The follow-up mammogram images confirm that the RF device is in the
expected location and are marked for Dr. Jumper.

The patient tolerated the procedure well and was released from the
[REDACTED].
IMPRESSION: Radiofrequency device localization of the left breast. No apparent
complications.

## 2022-01-02 ENCOUNTER — Other Ambulatory Visit: Payer: Self-pay | Admitting: Family Medicine

## 2022-01-02 DIAGNOSIS — L508 Other urticaria: Secondary | ICD-10-CM

## 2022-01-02 NOTE — Telephone Encounter (Signed)
Requested Prescriptions  ?Pending Prescriptions Disp Refills  ?? fexofenadine (ALLEGRA) 180 MG tablet [Pharmacy Med Name: FEXOFENADINE HCL 180 MG TABLET] 90 tablet 0  ?  Sig: TAKE 1 TABLET BY MOUTH EVERY DAY  ?  ? Ear, Nose, and Throat:  Antihistamines Passed - 01/02/2022  1:49 AM  ?  ?  Passed - Valid encounter within last 12 months  ?  Recent Outpatient Visits   ?      ? 9 months ago Benign paroxysmal positional vertigo, unspecified laterality  ? [facility] Juline Patch, MD  ? 10 months ago Mixed hyperlipidemia  ? [facility] Juline Patch, MD  ? 1 year ago Establishing care with new doctor, encounter for  ? [facility] Juline Patch, MD  ?  ?  ? ?  ?  ?  ? ?

## 2022-01-16 ENCOUNTER — Encounter: Payer: 59 | Admitting: Family Medicine

## 2022-03-01 ENCOUNTER — Encounter: Payer: 59 | Admitting: Family Medicine

## 2022-04-01 ENCOUNTER — Other Ambulatory Visit: Payer: Self-pay | Admitting: Family Medicine

## 2022-04-01 DIAGNOSIS — L508 Other urticaria: Secondary | ICD-10-CM

## 2022-04-24 ENCOUNTER — Telehealth: Payer: Self-pay

## 2022-04-24 DIAGNOSIS — Z1231 Encounter for screening mammogram for malignant neoplasm of breast: Secondary | ICD-10-CM

## 2022-04-24 DIAGNOSIS — N6489 Other specified disorders of breast: Secondary | ICD-10-CM

## 2022-04-24 NOTE — Telephone Encounter (Signed)
Patient contacted me back states that Dr. Christian Mate would not place order for her mammogram and that she has been dismissed. Patient is stating she was told that she needed to have her annual scheduled to have mammogram ordered. Please advise. KW

## 2022-04-24 NOTE — Telephone Encounter (Signed)
Patient contacted office triage line requesting a order be placed for mammogram. I contacted patient back and left voice message. According to patients chart last time she was seen in office was 2020, she had a diagnostic mammogram ordered by surgeon Dr. Ronny Bacon on 11/28/21 and has a ordered ultrasound  of right breast by Dr. Christian Mate that is ordered in chart but not scheduled. Patient has a upcoming appt in our office with Dr. Ernestina Patches on 05/21/22.

## 2022-04-24 NOTE — Telephone Encounter (Signed)
Left message for patient notifying patient that order has been placed. KW

## 2022-04-24 NOTE — Telephone Encounter (Signed)
Placed order for mammogram to allow client to schedule

## 2022-05-01 ENCOUNTER — Other Ambulatory Visit: Payer: Self-pay | Admitting: Family Medicine

## 2022-05-01 DIAGNOSIS — L508 Other urticaria: Secondary | ICD-10-CM

## 2022-05-21 ENCOUNTER — Encounter: Payer: Self-pay | Admitting: Family Medicine

## 2022-05-21 ENCOUNTER — Ambulatory Visit (INDEPENDENT_AMBULATORY_CARE_PROVIDER_SITE_OTHER): Payer: 59 | Admitting: Family Medicine

## 2022-05-21 VITALS — BP 122/74 | Ht 60.0 in | Wt 246.0 lb

## 2022-05-21 DIAGNOSIS — Z3046 Encounter for surveillance of implantable subdermal contraceptive: Secondary | ICD-10-CM

## 2022-05-21 DIAGNOSIS — Z136 Encounter for screening for cardiovascular disorders: Secondary | ICD-10-CM | POA: Diagnosis not present

## 2022-05-21 DIAGNOSIS — Z01419 Encounter for gynecological examination (general) (routine) without abnormal findings: Secondary | ICD-10-CM | POA: Diagnosis not present

## 2022-05-21 DIAGNOSIS — Z13220 Encounter for screening for lipoid disorders: Secondary | ICD-10-CM

## 2022-05-21 DIAGNOSIS — R234 Changes in skin texture: Secondary | ICD-10-CM

## 2022-05-21 NOTE — Progress Notes (Signed)
GYNECOLOGY ANNUAL PREVENTATIVE CARE ENCOUNTER NOTE  Subjective:   Julia Gallegos is a 56 y.o. G2P2 female here for a routine annual gynecologic exam.  Current complaints: none, feeling well. Has not had a PCP visit in > 1 year. Reports no bleeding since placement of Nexplanon. Reports lumpectomy last year.    Denies abnormal vaginal bleeding, discharge, pelvic pain, problems with intercourse or other gynecologic concerns.    Gynecologic History No LMP recorded. Patient has had an implant. Contraception: Nexplanon Last Pap: 2022. Results were: normal Last mammogram: 10/2021. Results were: normal  Health Maintenance Due  Topic Date Due   HIV Screening  Never done   Hepatitis C Screening  Never done   TETANUS/TDAP  Never done   Zoster Vaccines- Shingrix (1 of 2) Never done   COVID-19 Vaccine (3 - Moderna series) 07/15/2020    The following portions of the patient's history were reviewed and updated as appropriate: allergies, current medications, past family history, past medical history, past social history, past surgical history and problem list.  Review of Systems Pertinent items are noted in HPI.   Objective:  BP 122/74   Ht 5' (1.524 m)   Wt 246 lb (111.6 kg)   BMI 48.04 kg/m  CONSTITUTIONAL: Well-developed, well-nourished female in no acute distress.  HENT:  Normocephalic, atraumatic, External right and left ear normal. Oropharynx is clear and moist EYES:  No scleral icterus.  NECK: Normal range of motion, supple, no masses.  Normal thyroid.  SKIN: Skin is warm and dry. No rash noted. Not diaphoretic. No erythema. No pallor. NEUROLOGIC: Alert and oriented to person, place, and time. Normal reflexes, muscle tone coordination. No cranial nerve deficit noted. PSYCHIATRIC: Normal mood and affect. Normal behavior. Normal judgment and thought content. CARDIOVASCULAR: Normal heart rate noted, regular rhythm. 2+ distal pulses. RESPIRATORY: Effort and breath sounds normal, no  problems with respiration noted. BREASTS: Symmetric in size. Right breast with increase erythema surrounding areola, not present on left, No masses, nipple drainage, or lymphadenopathy. +scar on left breast from prior lumpectomy ABDOMEN: Soft,  no distention noted.  No tenderness, rebound or guarding.  PELVIC: Normal appearing external genitalia; Mildly atrophic vaginal mucosa and cervix. No abnormal discharge noted.  Normal uterine size, no other palpable masses, no uterine or adnexal tenderness. MUSCULOSKELETAL: Normal range of motion.   PROCEDURE NOTE:  Nexplanon Removal Patient identified, informed consent performed, consent signed.   Appropriate time out taken. Nexplanon site identified.  Area prepped in usual sterile fashon. One ml of 1% lidocaine was used to anesthetize the area at the distal end of the implant. A small stab incision was made right beside the implant on the distal portion.  The Nexplanon rod was grasped using hemostats and removed without difficulty.  There was minimal blood loss. There were no complications.  3 ml of 1% lidocaine was injected around the incision for post-procedure analgesia.  Steri-strips were applied over the small incision.  A pressure bandage was applied to reduce any bruising.  The patient tolerated the procedure well and was given post procedure instructions.    Assessment and Plan:  1) Annual gynecologic examination - pap not indicated, last was NIL HPV neg:  Routine preventative health maintenance measures emphasized. Reviewed perimenopausal symptoms and management.   1. Well woman exam with routine gynecological exam - Has mammogram this month - Reviewed diet/exercise - Encouraged  her to have PCP visit to review annual blood work - Reviewed postmenopausal precautions and counseled about symptoms  -  Lipid panel - Comprehensive metabolic panel - Hemoglobin A1c - TSH + free T4 - Follicle stimulating hormone - CBC  2. Encounter for lipid  screening for cardiovascular disease - Lipid panel  3. Nexplanon removal Patient likely postmenopausal  4. Breast skin changes - Reviewed skin changes on Right breast and recommend monitoring, if worsening or not improved recommend visit for punch bx.  - patient to monitor and call if not improved   Please refer to After Visit Summary for other counseling recommendations.   Return in about 1 year (around 05/22/2023) for Yearly wellness exam.  Caren Macadam, MD, MPH, ABFM Attending [facility]ician [facility] for [facility]

## 2022-05-22 ENCOUNTER — Ambulatory Visit
Admission: RE | Admit: 2022-05-22 | Discharge: 2022-05-22 | Disposition: A | Discharge status: Home / Self Care | Payer: 59 | Source: Ambulatory Visit | Attending: Family Medicine | Admitting: Family Medicine

## 2022-05-22 DIAGNOSIS — Z1231 Encounter for screening mammogram for malignant neoplasm of breast: Secondary | ICD-10-CM | POA: Insufficient documentation

## 2022-05-22 LAB — COMPREHENSIVE METABOLIC PANEL
ALT: 45 IU/L — ABNORMAL HIGH (ref 0–32)
AST: 60 IU/L — ABNORMAL HIGH (ref 0–40)
Albumin/Globulin Ratio: 1.2 (ref 1.2–2.2)
Albumin: 4.2 g/dL (ref 3.8–4.9)
Alkaline Phosphatase: 120 IU/L (ref 44–121)
BUN/Creatinine Ratio: 20 (ref 9–23)
BUN: 12 mg/dL (ref 6–24)
Bilirubin Total: 0.9 mg/dL (ref 0.0–1.2)
CO2: 22 mmol/L (ref 20–29)
Calcium: 9.4 mg/dL (ref 8.7–10.2)
Chloride: 102 mmol/L (ref 96–106)
Creatinine, Ser: 0.6 mg/dL (ref 0.57–1.00)
Globulin, Total: 3.4 g/dL (ref 1.5–4.5)
Glucose: 112 mg/dL — ABNORMAL HIGH (ref 70–99)
Potassium: 4.5 mmol/L (ref 3.5–5.2)
Sodium: 139 mmol/L (ref 134–144)
Total Protein: 7.6 g/dL (ref 6.0–8.5)
eGFR: 106 mL/min/{1.73_m2} (ref >59)

## 2022-05-22 LAB — CBC
Hematocrit: 42.4 % (ref 34.0–46.6)
Hemoglobin: 14.9 g/dL (ref 11.1–15.9)
MCH: 33.2 pg — ABNORMAL HIGH (ref 26.6–33.0)
MCHC: 35.1 g/dL (ref 31.5–35.7)
MCV: 94 fL (ref 79–97)
Platelets: 230 10*3/uL (ref 150–450)
RBC: 4.49 x10E6/uL (ref 3.77–5.28)
RDW: 11.2 % — ABNORMAL LOW (ref 11.7–15.4)
WBC: 7.8 10*3/uL (ref 3.4–10.8)

## 2022-05-22 LAB — LIPID PANEL
Chol/HDL Ratio: 4.7 ratio — ABNORMAL HIGH (ref 0.0–4.4)
Cholesterol, Total: 198 mg/dL (ref 100–199)
HDL: 42 mg/dL (ref >39)
LDL Chol Calc (NIH): 132 mg/dL — ABNORMAL HIGH (ref 0–99)
Triglycerides: 132 mg/dL (ref 0–149)
VLDL Cholesterol Cal: 24 mg/dL (ref 5–40)

## 2022-05-22 LAB — HEMOGLOBIN A1C
Est. average glucose Bld gHb Est-mCnc: 151 mg/dL
Hgb A1c MFr Bld: 6.9 % — ABNORMAL HIGH (ref 4.8–5.6)

## 2022-05-22 LAB — FOLLICLE STIMULATING HORMONE: FSH: 29.8 m[IU]/mL

## 2022-05-22 LAB — TSH+FREE T4
Free T4: 1.15 ng/dL (ref 0.82–1.77)
TSH: 3.33 u[IU]/mL (ref 0.450–4.500)

## 2022-05-23 ENCOUNTER — Encounter: Payer: Self-pay | Admitting: Family Medicine

## 2022-05-23 DIAGNOSIS — Z780 Asymptomatic menopausal state: Secondary | ICD-10-CM | POA: Insufficient documentation

## 2022-05-23 DIAGNOSIS — E119 Type 2 diabetes mellitus without complications: Secondary | ICD-10-CM | POA: Insufficient documentation

## 2022-07-19 ENCOUNTER — Other Ambulatory Visit: Payer: Self-pay | Admitting: Family Medicine

## 2022-07-19 DIAGNOSIS — L508 Other urticaria: Secondary | ICD-10-CM

## 2022-07-19 NOTE — Telephone Encounter (Signed)
Requested medication (s) are due for refill today - yes  Requested medication (s) are on the active medication list -yes  Future visit scheduled -no  Last refill: 05/01/22 #30  Notes to clinic: Attempted to call patient to schedule appointment- left message to call office. Patient may have already had courtesy RF- sent for review   Requested Prescriptions  Pending Prescriptions Disp Refills   fexofenadine (ALLEGRA) 180 MG tablet [Pharmacy Med Name: FEXOFENADINE HCL 180 MG TABLET] 30 tablet 0    Sig: TAKE 1 TABLET BY MOUTH EVERY DAY     Ear, Nose, and Throat:  Antihistamines Failed - 07/19/2022  2:15 AM      Failed - Valid encounter within last 12 months    Recent Outpatient Visits           1 year ago Benign paroxysmal positional vertigo, unspecified laterality   [facility] [place], Deanna C, MD   1 year ago Mixed hyperlipidemia   [facility] [place], Deanna C, MD   2 years ago Establishing care with new doctor, encounter for   [facility] [place], Deanna C, MD                 Requested Prescriptions  Pending Prescriptions Disp Refills   fexofenadine (ALLEGRA) 180 MG tablet [Pharmacy Med Name: FEXOFENADINE HCL 180 MG TABLET] 30 tablet 0    Sig: TAKE 1 TABLET BY MOUTH EVERY DAY     Ear, Nose, and Throat:  Antihistamines Failed - 07/19/2022  2:15 AM      Failed - Valid encounter within last 12 months    Recent Outpatient Visits           1 year ago Benign paroxysmal positional vertigo, unspecified laterality   [facility] [place], Deanna C, MD   1 year ago Mixed hyperlipidemia   [facility] [place], Deanna C, MD   2 years ago Establishing care with new doctor, encounter for   [facility] [place], Deanna C, MD

## 2022-07-19 NOTE — Telephone Encounter (Signed)
Left voice mail to set up medication refill appointment °

## 2022-09-10 ENCOUNTER — Encounter: Payer: Self-pay | Admitting: Family Medicine

## 2022-09-10 ENCOUNTER — Ambulatory Visit: Payer: 59 | Admitting: Family Medicine

## 2022-09-10 VITALS — BP 126/90 | HR 95 | Ht 60.0 in | Wt 247.0 lb

## 2022-09-10 DIAGNOSIS — Z23 Encounter for immunization: Secondary | ICD-10-CM

## 2022-09-10 DIAGNOSIS — E1169 Type 2 diabetes mellitus with other specified complication: Secondary | ICD-10-CM

## 2022-09-10 DIAGNOSIS — E782 Mixed hyperlipidemia: Secondary | ICD-10-CM

## 2022-09-10 NOTE — Patient Instructions (Signed)

## 2022-09-10 NOTE — Progress Notes (Signed)
Date:  09/10/2022   Name:  Julia Gallegos   DOB:  [birth year]   MRN:  [id number]   Chief Complaint: Bleeding/Bruising (X1 month, Both legs, itching and the bruising, getting worse in the past two weeks, tried location and pt takes allegra daily)  Hyperlipidemia This is a new problem. The current episode started more than 1 month ago. The problem is uncontrolled. Exacerbating diseases include diabetes and obesity. She has no history of chronic renal disease, hypothyroidism or liver disease. Pertinent negatives include no focal sensory loss, focal weakness, myalgias or shortness of breath. Current antihyperlipidemic treatment includes diet change. The current treatment provides moderate improvement of lipids.  Diabetes She presents for her follow-up diabetic visit. She has type 2 diabetes mellitus. Pertinent negatives for hypoglycemia include no dizziness, headaches or nervousness/anxiousness. Pertinent negatives for diabetes include no blurred vision, no fatigue, no polydipsia, no polyuria, no weakness and no weight loss. Symptoms are stable.    Lab Results  Component Value Date   NA 139 05/21/2022   K 4.5 05/21/2022   CO2 22 05/21/2022   GLUCOSE 112 (H) 05/21/2022   BUN 12 05/21/2022   CREATININE 0.60 05/21/2022   CALCIUM 9.4 05/21/2022   EGFR 106 05/21/2022   GFRNONAA >60 05/21/2021   Lab Results  Component Value Date   CHOL 198 05/21/2022   HDL 42 05/21/2022   LDLCALC 132 (H) 05/21/2022   TRIG 132 05/21/2022   CHOLHDL 4.7 (H) 05/21/2022   Lab Results  Component Value Date   TSH 3.330 05/21/2022   Lab Results  Component Value Date   HGBA1C 6.9 (H) 05/21/2022   Lab Results  Component Value Date   WBC 7.8 05/21/2022   HGB 14.9 05/21/2022   HCT 42.4 05/21/2022   MCV 94 05/21/2022   PLT 230 05/21/2022   Lab Results  Component Value Date   ALT 45 (H) 05/21/2022   AST 60 (H) 05/21/2022   ALKPHOS 120 05/21/2022   BILITOT 0.9 05/21/2022   No results found for:  "25OHVITD2", "25OHVITD3", "VD25OH"   Review of Systems  Constitutional: Negative.  Negative for chills, fatigue, fever, unexpected weight change and weight loss.  HENT:  Negative for congestion, ear discharge, ear pain, rhinorrhea, sinus pressure, sneezing and sore throat.   Eyes:  Negative for blurred vision.  Respiratory:  Negative for cough, shortness of breath, wheezing and stridor.   Gastrointestinal:  Negative for abdominal pain, blood in stool, constipation, diarrhea and nausea.  Endocrine: Negative for polydipsia and polyuria.  Genitourinary:  Negative for dysuria, flank pain, frequency, hematuria, urgency and vaginal discharge.  Musculoskeletal:  Negative for arthralgias, back pain and myalgias.  Skin:  Negative for rash.  Neurological:  Negative for dizziness, focal weakness, weakness and headaches.  Hematological:  Negative for adenopathy. Does not bruise/bleed easily.  Psychiatric/Behavioral:  Negative for dysphoric mood. The patient is not nervous/anxious.     Patient Active Problem List   Diagnosis Date Noted   Type 2 diabetes mellitus ([place]) 05/23/2022   Postmenopausal 05/23/2022   Radial scar of left breast 04/12/2021   Morbid obesity ([place]) 04/12/2021   Encounter for screening colonoscopy    Polyp of colon     Allergies  Allergen Reactions   Naproxen Nausea Only    Other reaction(s): Dizziness   Skin Adhesives [Cyanoacrylate] Other (See Comments)    Bandaids cause skin to blister   Vicodin [Hydrocodone-Acetaminophen] Itching    Past Surgical History:  Procedure Laterality Date   BREAST BIOPSY  Left 04/03/2021   affirm bx, ribbon marker, radial scar   BREAST LUMPECTOMY WITH RADIOFREQUENCY TAG IDENTIFICATION Left 0/35/0093   Procedure: BREAST LUMPECTOMY WITH RADIOFREQUENCY TAG IDENTIFICATION;  Surgeon: Ronny Bacon, MD;  Location: [facility] ORS;  Service: General;  Laterality: Left;   COLONOSCOPY WITH PROPOFOL N/A 06/09/2020   Procedure: COLONOSCOPY WITH  PROPOFOL;  Surgeon: Virgel Manifold, MD;  Location: [facility] ENDOSCOPY;  Service: Endoscopy;  Laterality: N/A;   WISDOM TOOTH EXTRACTION      Social History   Tobacco Use   Smoking status: Former    Types: Cigarettes    Quit date: 08/27/2016    Years since quitting: 6.0   Smokeless tobacco: Never  Vaping Use   Vaping Use: Never used  Substance Use Topics   Alcohol use: Yes    Comment: occasionally   Drug use: Never     Medication list has been reviewed and updated.  Current Meds  Medication Sig   Esomeprazole Magnesium 20 MG TBEC Take 20 mg by mouth daily at 12 noon.   fexofenadine (ALLEGRA) 180 MG tablet TAKE 1 TABLET BY MOUTH EVERY DAY       09/10/2022    2:18 PM 04/06/2021    3:56 PM 02/28/2021    8:06 AM 04/18/2020    3:39 PM  GAD 7 : Generalized Anxiety Score  Nervous, Anxious, on Edge 0 0 0 0  Control/stop worrying 0 0 0 0  Worry too much - different things 0 0 0 0  Trouble relaxing 0 0 0 0  Restless 0 0 0 0  Easily annoyed or irritable 0 0 0 0  Afraid - awful might happen 0 0 0 0  Total GAD 7 Score 0 0 0 0  Anxiety Difficulty Not difficult at all          09/10/2022    2:17 PM 04/06/2021    3:56 PM 02/28/2021    8:06 AM  Depression screen PHQ 2/9  Decreased Interest 0 0 0  Down, Depressed, Hopeless 0 0 0  PHQ - 2 Score 0 0 0  Altered sleeping 0 0 0  Tired, decreased energy 0 0 0  Change in appetite 0 0 0  Feeling bad or failure about yourself  0 0 0  Trouble concentrating 0 0 0  Moving slowly or fidgety/restless 0 0 0  Suicidal thoughts  0 0  PHQ-9 Score 0 0 0  Difficult doing work/chores Not difficult at all      BP Readings from Last 3 Encounters:  09/10/22 (!) 126/90  05/21/22 122/74  12/06/21 134/78    Physical Exam Vitals and nursing note reviewed. Exam conducted with a chaperone present.  Constitutional:      General: She is not in acute distress.    Appearance: She is not diaphoretic.  HENT:     Head: Normocephalic and atraumatic.      Right Ear: Tympanic membrane and external ear normal.     Left Ear: Tympanic membrane and external ear normal.     Nose: Nose normal.  Eyes:     General:        Right eye: No discharge.        Left eye: No discharge.     Conjunctiva/sclera: Conjunctivae normal.     Pupils: Pupils are equal, round, and reactive to light.  Neck:     Thyroid: No thyromegaly.     Vascular: No JVD.  Cardiovascular:     Rate and Rhythm: Normal rate  and regular rhythm.     Heart sounds: Normal heart sounds. No murmur heard.    No friction rub. No gallop.  Pulmonary:     Effort: Pulmonary effort is normal.     Breath sounds: Normal breath sounds. No wheezing or rhonchi.  Abdominal:     General: Bowel sounds are normal.     Palpations: Abdomen is soft. There is no mass.     Tenderness: There is no abdominal tenderness. There is no guarding or rebound.  Musculoskeletal:        General: Normal range of motion.     Cervical back: Normal range of motion and neck supple.  Lymphadenopathy:     Cervical: No cervical adenopathy.  Skin:    General: Skin is warm and dry.  Neurological:     Mental Status: She is alert.     Deep Tendon Reflexes: Reflexes are normal and symmetric.     Wt Readings from Last 3 Encounters:  09/10/22 247 lb (112 kg)  05/21/22 246 lb (111.6 kg)  12/06/21 240 lb 9.6 oz (109.1 kg)    BP (!) 126/90   Pulse 95   Ht 5' (1.524 m)   Wt 247 lb (112 kg)   LMP  (LMP Unknown)   SpO2 98%   BMI 48.24 kg/m   Assessment and Plan: 1. Mixed hyperlipidemia New onset.  Evaluated by GYN with elevated LDL.  Patient is here for further evaluation and guidance.  Patient has been given low-cholesterol low triglyceride guidelines to follow for the next 4 weeks and will return this will be 4 months out from her GYN appointment and we will recheck at that time.  2. Type 2 diabetes mellitus with other specified complication, without long-term current use of insulin ([place]) Also on the same GYN  appointment it was noted that patient had an elevated A1c of 6.9 but she has been doing better with her diet but is only been 3 months would like for this to go an additional 4 weeks and we will recheck the patient at which time we will do an A1c that will be more meaningful.  3. Need for immunization against influenza Discussed and administered. - Flu Vaccine QUAD 51moIM (Fluarix, Fluzone & Alfiuria Quad PF)   DOtilio Miu MD

## 2022-10-09 ENCOUNTER — Encounter: Payer: 59 | Admitting: Family Medicine

## 2022-10-11 ENCOUNTER — Ambulatory Visit: Payer: 59 | Admitting: Family Medicine

## 2022-10-11 ENCOUNTER — Encounter: Payer: Self-pay | Admitting: Family Medicine

## 2022-10-11 ENCOUNTER — Other Ambulatory Visit: Payer: Self-pay | Admitting: Family Medicine

## 2022-10-11 VITALS — BP 128/78 | HR 91 | Ht 60.0 in | Wt 245.0 lb

## 2022-10-11 DIAGNOSIS — E782 Mixed hyperlipidemia: Secondary | ICD-10-CM

## 2022-10-11 DIAGNOSIS — E1169 Type 2 diabetes mellitus with other specified complication: Secondary | ICD-10-CM | POA: Diagnosis not present

## 2022-10-11 DIAGNOSIS — L2084 Intrinsic (allergic) eczema: Secondary | ICD-10-CM

## 2022-10-11 DIAGNOSIS — L508 Other urticaria: Secondary | ICD-10-CM | POA: Diagnosis not present

## 2022-10-11 MED ORDER — FEXOFENADINE HCL 180 MG PO TABS: 180.0000 mg | ORAL_TABLET | Freq: Every day | ORAL | 1 refills | Status: DC

## 2022-10-11 MED ORDER — ESOMEPRAZOLE MAGNESIUM 20 MG PO TBEC: 20.0000 mg | DELAYED_RELEASE_TABLET | Freq: Every day | ORAL | 1 refills | Status: DC

## 2022-10-11 NOTE — Progress Notes (Signed)
Date:  10/11/2022   Name:  Julia Gallegos   DOB:  1966/07/24   MRN:  [id number]   Chief Complaint: Diabetes (Foot exam, Need eye exam. Need A1C, and Microalbumin Urine.)  Diabetes She presents for her follow-up diabetic visit. She has type 2 diabetes mellitus. Her disease course has been stable. There are no hypoglycemic associated symptoms. Pertinent negatives for hypoglycemia include no dizziness, headaches or nervousness/anxiousness. There are no diabetic associated symptoms. Pertinent negatives for diabetes include no blurred vision, no chest pain, no fatigue, no foot paresthesias, no foot ulcerations, no polydipsia, no polyuria, no weakness and no weight loss. There are no hypoglycemic complications. Symptoms are stable. Pertinent negatives for diabetic complications include no CVA, heart disease or nephropathy. There are no known risk factors for coronary artery disease. Current diabetic treatment includes diet. Her weight is stable. She is following a generally healthy diet. An ACE inhibitor/angiotensin II receptor blocker is not being taken.    Lab Results  Component Value Date   NA 139 05/21/2022   K 4.5 05/21/2022   CO2 22 05/21/2022   GLUCOSE 112 (H) 05/21/2022   BUN 12 05/21/2022   CREATININE 0.60 05/21/2022   CALCIUM 9.4 05/21/2022   EGFR 106 05/21/2022   GFRNONAA >60 05/21/2021   Lab Results  Component Value Date   CHOL 198 05/21/2022   HDL 42 05/21/2022   LDLCALC 132 (H) 05/21/2022   TRIG 132 05/21/2022   CHOLHDL 4.7 (H) 05/21/2022   Lab Results  Component Value Date   TSH 3.330 05/21/2022   Lab Results  Component Value Date   HGBA1C 6.9 (H) 05/21/2022   Lab Results  Component Value Date   WBC 7.8 05/21/2022   HGB 14.9 05/21/2022   HCT 42.4 05/21/2022   MCV 94 05/21/2022   PLT 230 05/21/2022   Lab Results  Component Value Date   ALT 45 (H) 05/21/2022   AST 60 (H) 05/21/2022   ALKPHOS 120 05/21/2022   BILITOT 0.9 05/21/2022   No results found for:  "25OHVITD2", "25OHVITD3", "VD25OH"   Review of Systems  Constitutional: Negative.  Negative for chills, fatigue, fever, unexpected weight change and weight loss.  HENT:  Negative for congestion, ear discharge, ear pain, rhinorrhea, sinus pressure, sneezing and sore throat.   Eyes:  Negative for blurred vision.  Respiratory:  Negative for cough, shortness of breath, wheezing and stridor.   Cardiovascular:  Negative for chest pain.  Gastrointestinal:  Negative for abdominal pain, blood in stool, constipation, diarrhea and nausea.  Endocrine: Negative for polydipsia and polyuria.  Genitourinary:  Negative for dysuria, flank pain, frequency, hematuria, urgency and vaginal discharge.  Musculoskeletal:  Negative for arthralgias, back pain and myalgias.  Skin:  Negative for rash.  Neurological:  Negative for dizziness, weakness and headaches.  Hematological:  Negative for adenopathy. Does not bruise/bleed easily.  Psychiatric/Behavioral:  Negative for dysphoric mood. The patient is not nervous/anxious.     Patient Active Problem List   Diagnosis Date Noted   Type 2 diabetes mellitus ([place]) 05/23/2022   Postmenopausal 05/23/2022   Radial scar of left breast 04/12/2021   Morbid obesity ([place]) 04/12/2021   Encounter for screening colonoscopy    Polyp of colon     Allergies  Allergen Reactions   Naproxen Nausea Only    Other reaction(s): Dizziness   Skin Adhesives [Cyanoacrylate] Other (See Comments)    Bandaids cause skin to blister   Vicodin [Hydrocodone-Acetaminophen] Itching    Past Surgical History:  Procedure Laterality Date   BREAST BIOPSY Left 04/03/2021   affirm bx, ribbon marker, radial scar   BREAST LUMPECTOMY WITH RADIOFREQUENCY TAG IDENTIFICATION Left 8/46/6599   Procedure: BREAST LUMPECTOMY WITH RADIOFREQUENCY TAG IDENTIFICATION;  Surgeon: Ronny Bacon, MD;  Location: [facility] OR[facility];  Service: General;  Laterality: Left;   COLONOSCOPY WITH PROPOFOL N/A 06/09/2020    Procedure: COLONOSCOPY WITH PROPOFOL;  Surgeon: Virgel Manifold, MD;  Location: [facility];  Service: Endoscopy;  Laterality: N/A;   WISDOM TOOTH EXTRACTION      Social History   Tobacco Use   Smoking status: Former    Types: Cigarettes    Quit date: 08/27/2016    Years since quitting: 6.1   Smokeless tobacco: Never  Vaping Use   Vaping Use: Never used  Substance Use Topics   Alcohol use: Yes    Comment: occasionally   Drug use: Never     Medication list has been reviewed and updated.  Current Meds  Medication Sig   Esomeprazole Magnesium 20 MG TBEC Take 20 mg by mouth daily at 12 noon.   fexofenadine (ALLEGRA) 180 MG tablet TAKE 1 TABLET BY MOUTH EVERY DAY       09/10/2022    2:18 PM 04/06/2021    3:56 PM 02/28/2021    8:06 AM 04/18/2020    3:39 PM  GAD 7 : Generalized Anxiety Score  Nervous, Anxious, on Edge 0 0 0 0  Control/stop worrying 0 0 0 0  Worry too much - different things 0 0 0 0  Trouble relaxing 0 0 0 0  Restless 0 0 0 0  Easily annoyed or irritable 0 0 0 0  Afraid - awful might happen 0 0 0 0  Total GAD 7 Score 0 0 0 0  Anxiety Difficulty Not difficult at all          09/10/2022    2:17 PM 04/06/2021    3:56 PM 02/28/2021    8:06 AM  Depression screen PHQ 2/9  Decreased Interest 0 0 0  Down, Depressed, Hopeless 0 0 0  PHQ - 2 Score 0 0 0  Altered sleeping 0 0 0  Tired, decreased energy 0 0 0  Change in appetite 0 0 0  Feeling bad or failure about yourself  0 0 0  Trouble concentrating 0 0 0  Moving slowly or fidgety/restless 0 0 0  Suicidal thoughts  0 0  PHQ-9 Score 0 0 0  Difficult doing work/chores Not difficult at all      BP Readings from Last 3 Encounters:  10/11/22 128/78  09/10/22 (!) 126/90  05/21/22 122/74    Physical Exam Vitals and nursing note reviewed.  Constitutional:      Appearance: She is well-developed.  HENT:     Head: Normocephalic.     Right Ear: External ear normal.     Left Ear: External ear  normal.  Eyes:     General: Lids are everted, no foreign bodies appreciated. No scleral icterus.       Left eye: No foreign body or hordeolum.     Conjunctiva/sclera: Conjunctivae normal.     Right eye: Right conjunctiva is not injected.     Left eye: Left conjunctiva is not injected.     Pupils: Pupils are equal, round, and reactive to light.  Neck:     Thyroid: No thyromegaly.     Vascular: No JVD.     Trachea: No tracheal deviation.  Cardiovascular:  Rate and Rhythm: Normal rate and regular rhythm.     Heart sounds: Normal heart sounds, S1 normal and S2 normal. No murmur heard.    No systolic murmur is present.     No diastolic murmur is present.     No friction rub. No gallop. No S3 or S4 sounds.  Pulmonary:     Effort: Pulmonary effort is normal. No respiratory distress.     Breath sounds: Normal breath sounds. No decreased breath sounds, wheezing, rhonchi or rales.  Abdominal:     General: Bowel sounds are normal.     Palpations: Abdomen is soft. There is no mass.     Tenderness: There is no abdominal tenderness. There is no guarding or rebound.  Musculoskeletal:        General: No tenderness. Normal range of motion.     Cervical back: Normal range of motion and neck supple.  Lymphadenopathy:     Cervical: No cervical adenopathy.  Skin:    General: Skin is warm.     Findings: No rash.  Neurological:     Mental Status: She is alert.  Psychiatric:        Mood and Affect: Mood is not anxious or depressed.     Wt Readings from Last 3 Encounters:  10/11/22 245 lb (111.1 kg)  09/10/22 247 lb (112 kg)  05/21/22 246 lb (111.6 kg)    BP 128/78   Pulse 91   Ht 5' (1.524 m)   Wt 245 lb (111.1 kg)   LMP  (LMP Unknown)   SpO2 97%   BMI 47.85 kg/m   Assessment and Plan:  1. Type 2 diabetes mellitus with other specified complication, without long-term current use of insulin (HCC) Chronic.  Controlled.  Stable.  Patient is controlling with diet initially we will  check A1c and microalbuminuria.  Will adjust accordingly. - HgB A1c - Microalbumin / creatinine urine ratio  2. Mixed hyperlipidemia Chronic.  Controlled.  Stable.  Diet controlled.  Will check lipid panel and if elevated likely will start statin agent. - Lipid Panel With LDL/HDL Ratio  3. Chronic urticaria .  Controlled.  Stable.  Continue fexofenadine 180 mg tablet once a day for eczema. - fexofenadine (ALLEGRA) 180 MG tablet; Take 1 tablet (180 mg total) by mouth daily.  Dispense: 90 tablet; Refill: 1  4. Intrinsic eczema .  Controlled.  Stable.  As above - fexofenadine (ALLEGRA) 180 MG tablet; Take 1 tablet (180 mg total) by mouth daily.  Dispense: 90 tablet; Refill: 1  5. Hypomagnesemia Chronic.  Controlled.  Stable.  Will continue with magnesium 20 mg taken at daily.  Will recheck in 6 months. - Esomeprazole Magnesium 20 MG TBEC; Take 20 mg by mouth daily at 12 noon.  Dispense: 90 tablet; Refill: 1    Otilio Miu, MD

## 2022-10-11 NOTE — Telephone Encounter (Signed)
Per pharmacy: Pharmacy comment: Script Clarification:CAN WE CHANGE TO [facility] WHICH ARE PREFERRED ON INSURANCE.

## 2022-10-13 LAB — MICROALBUMIN / CREATININE URINE RATIO
Creatinine, Urine: 158.3 mg/dL
Microalb/Creat Ratio: 8 mg/g creat (ref 0–29)
Microalbumin, Urine: 12 ug/mL

## 2022-10-13 LAB — LIPID PANEL WITH LDL/HDL RATIO
Cholesterol, Total: 185 mg/dL (ref 100–199)
HDL: 39 mg/dL — ABNORMAL LOW (ref >39)
LDL Chol Calc (NIH): 129 mg/dL — ABNORMAL HIGH (ref 0–99)
LDL/HDL Ratio: 3.3 ratio — ABNORMAL HIGH (ref 0.0–3.2)
Triglycerides: 91 mg/dL (ref 0–149)
VLDL Cholesterol Cal: 17 mg/dL (ref 5–40)

## 2022-10-13 LAB — HEMOGLOBIN A1C
Est. average glucose Bld gHb Est-mCnc: 131 mg/dL
Hgb A1c MFr Bld: 6.2 % — ABNORMAL HIGH (ref 4.8–5.6)

## 2022-10-14 ENCOUNTER — Other Ambulatory Visit: Payer: Self-pay

## 2022-10-14 DIAGNOSIS — E782 Mixed hyperlipidemia: Secondary | ICD-10-CM

## 2022-10-14 MED ORDER — ATORVASTATIN CALCIUM 10 MG PO TABS: 10.0000 mg | ORAL_TABLET | Freq: Every day | ORAL | 3 refills | Status: DC

## 2022-10-14 NOTE — Progress Notes (Signed)
PC to pt, discussed lab results, pt voiced understanding. Pt would like to try atorvastatin, I sent it in to her pharmacy.

## 2022-10-25 ENCOUNTER — Telehealth: Payer: Self-pay | Admitting: *Deleted

## 2022-10-25 NOTE — Patient Outreach (Signed)
  Care Coordination   Initial Visit Note   10/25/2022 Name: DEAYSIA GRIGORYAN MRN: [id number] DOB: 1966/10/19  CLEONA DOUBLEDAY is a 56 y.o. year old female who sees Juline Patch, MD for primary care. I spoke with  Lurlean Horns by phone today.  What matters to the patients health and wellness today?  Report she is doing well, all conditions are well controlled at this time.  Declines offer to participate in program but agrees to be mailed [facility] brochure for future purposes.      SDOH assessments and interventions completed:  No     Care Coordination Interventions:  Yes, provided   Follow up plan: No further intervention required.   Encounter Outcome:  Pt. Visit Completed   Valente David, RN, MSN, [place] Care Management Care Management Coordinator [phone]

## 2022-10-29 LAB — HM DIABETES EYE EXAM

## 2023-04-08 ENCOUNTER — Other Ambulatory Visit: Payer: Self-pay | Admitting: Family Medicine

## 2023-04-08 DIAGNOSIS — Z1231 Encounter for screening mammogram for malignant neoplasm of breast: Secondary | ICD-10-CM

## 2023-04-11 ENCOUNTER — Ambulatory Visit: Payer: 59 | Admitting: Family Medicine

## 2023-04-13 ENCOUNTER — Other Ambulatory Visit: Payer: Self-pay | Admitting: Family Medicine

## 2023-04-13 DIAGNOSIS — L508 Other urticaria: Secondary | ICD-10-CM

## 2023-04-13 DIAGNOSIS — L2084 Intrinsic (allergic) eczema: Secondary | ICD-10-CM

## 2023-04-14 NOTE — Telephone Encounter (Signed)
Requested Prescriptions  Pending Prescriptions Disp Refills   fexofenadine (ALLEGRA) 180 MG tablet [Pharmacy Med Name: FEXOFENADINE HCL 180 MG TABLET] 90 tablet 1    Sig: TAKE 1 TABLET BY MOUTH EVERY DAY     Ear, Nose, and Throat:  Antihistamines Passed - 04/13/2023  8:50 AM      Passed - Valid encounter within last 12 months    Recent Outpatient Visits           6 months ago Type 2 diabetes mellitus with other specified complication, without long-term current use of insulin (HCC)   [place] [facility]   7 months ago Mixed hyperlipidemia   [place] [facility]   2 years ago Benign paroxysmal positional vertigo, unspecified laterality   [place] [facility]   2 years ago Mixed hyperlipidemia   [place] [facility]   2 years ago Establishing care with new doctor, encounter for   [facility]       Future Appointments             In 4 days Duanne Limerick, MD [facility] at [facility], [facility]

## 2023-04-18 ENCOUNTER — Ambulatory Visit: Payer: 59 | Admitting: Family Medicine

## 2023-04-18 ENCOUNTER — Encounter: Payer: Self-pay | Admitting: Family Medicine

## 2023-04-18 VITALS — BP 120/74 | HR 100 | Ht 60.0 in | Wt 257.0 lb

## 2023-04-18 DIAGNOSIS — K219 Gastro-esophageal reflux disease without esophagitis: Secondary | ICD-10-CM

## 2023-04-18 DIAGNOSIS — L2084 Intrinsic (allergic) eczema: Secondary | ICD-10-CM

## 2023-04-18 DIAGNOSIS — E782 Mixed hyperlipidemia: Secondary | ICD-10-CM | POA: Diagnosis not present

## 2023-04-18 DIAGNOSIS — R7303 Prediabetes: Secondary | ICD-10-CM | POA: Diagnosis not present

## 2023-04-18 DIAGNOSIS — L508 Other urticaria: Secondary | ICD-10-CM | POA: Diagnosis not present

## 2023-04-18 MED ORDER — PANTOPRAZOLE SODIUM 40 MG PO TBEC
40.0000 mg | DELAYED_RELEASE_TABLET | Freq: Every day | ORAL | 3 refills | Status: DC
Start: 2023-04-18 — End: 2023-07-15

## 2023-04-18 MED ORDER — FEXOFENADINE HCL 180 MG PO TABS: 180.0000 mg | ORAL_TABLET | Freq: Every day | ORAL | 1 refills | Status: DC

## 2023-04-18 MED ORDER — ATORVASTATIN CALCIUM 10 MG PO TABS: 10.0000 mg | ORAL_TABLET | Freq: Every day | ORAL | 1 refills | Status: DC

## 2023-04-18 NOTE — Progress Notes (Signed)
Date:  04/18/2023   Name:  Julia Gallegos   DOB:  May 23, 1966   MRN:  [id number]   Chief Complaint: Gastroesophageal Reflux, Hyperlipidemia, Allergic Rhinitis , and Prediabetes  Gastroesophageal Reflux She complains of heartburn. She reports no abdominal pain, no belching, no coughing, no dysphagia, no nausea, no sore throat or no wheezing. This is a chronic problem. The current episode started more than 1 year ago. The problem has been gradually improving (with ppi). The symptoms are aggravated by certain foods. Pertinent negatives include no fatigue. She has tried a PPI for the symptoms. The treatment provided moderate relief.  Hyperlipidemia This is a chronic problem. The problem is controlled. Recent lipid tests were reviewed and are normal. Exacerbating diseases include diabetes and obesity. She has no history of chronic renal disease. Pertinent negatives include no myalgias or shortness of breath. The current treatment provides moderate improvement of lipids. There are no compliance problems.   Diabetes She presents for her follow-up diabetic visit. Diabetes type: prediabetes. Her disease course has been stable. There are no hypoglycemic associated symptoms. Pertinent negatives for hypoglycemia include no dizziness, headaches or nervousness/anxiousness. There are no diabetic associated symptoms. Pertinent negatives for diabetes include no fatigue and no weakness. There are no hypoglycemic complications. Symptoms are stable. There are no diabetic complications. Risk factors for coronary artery disease include hypertension. She is following a generally healthy ("some of the Time") diet. Meal planning includes avoidance of concentrated sweets and carbohydrate counting.    Lab Results  Component Value Date   NA 139 05/21/2022   K 4.5 05/21/2022   CO2 22 05/21/2022   GLUCOSE 112 (H) 05/21/2022   BUN 12 05/21/2022   CREATININE 0.60 05/21/2022   CALCIUM 9.4 05/21/2022   EGFR 106 05/21/2022    GFRNONAA >60 05/21/2021   Lab Results  Component Value Date   CHOL 185 10/11/2022   HDL 39 (L) 10/11/2022   LDLCALC 129 (H) 10/11/2022   TRIG 91 10/11/2022   CHOLHDL 4.7 (H) 05/21/2022   Lab Results  Component Value Date   TSH 3.330 05/21/2022   Lab Results  Component Value Date   HGBA1C 6.2 (H) 10/11/2022   Lab Results  Component Value Date   WBC 7.8 05/21/2022   HGB 14.9 05/21/2022   HCT 42.4 05/21/2022   MCV 94 05/21/2022   PLT 230 05/21/2022   Lab Results  Component Value Date   ALT 45 (H) 05/21/2022   AST 60 (H) 05/21/2022   ALKPHOS 120 05/21/2022   BILITOT 0.9 05/21/2022   No results found for: "25OHVITD2", "25OHVITD3", "VD25OH"   Review of Systems  Constitutional: Negative.  Negative for chills, fatigue, fever and unexpected weight change.  HENT:  Negative for congestion, ear discharge, ear pain, rhinorrhea, sinus pressure, sneezing and sore throat.   Respiratory:  Negative for cough, shortness of breath, wheezing and stridor.   Gastrointestinal:  Positive for heartburn. Negative for abdominal pain, blood in stool, constipation, diarrhea, dysphagia and nausea.  Genitourinary:  Negative for dysuria, flank pain, frequency, hematuria, urgency and vaginal discharge.  Musculoskeletal:  Negative for arthralgias, back pain and myalgias.  Skin:  Negative for rash.  Neurological:  Negative for dizziness, weakness and headaches.  Hematological:  Negative for adenopathy. Does not bruise/bleed easily.  Psychiatric/Behavioral:  Negative for dysphoric mood. The patient is not nervous/anxious.     Patient Active Problem List   Diagnosis Date Noted   Type 2 diabetes mellitus (HCC) 05/23/2022   Postmenopausal 05/23/2022  Radial scar of left breast 04/12/2021   Morbid obesity (HCC) 04/12/2021   Encounter for screening colonoscopy    Polyp of colon     Allergies  Allergen Reactions   Naproxen Nausea Only    Other reaction(s): Dizziness   Skin Adhesives  [Cyanoacrylate] Other (See Comments)    Bandaids cause skin to blister   Vicodin [Hydrocodone-Acetaminophen] Itching    Past Surgical History:  Procedure Laterality Date   BREAST BIOPSY Left 04/03/2021   affirm bx, ribbon marker, radial scar   BREAST LUMPECTOMY WITH RADIOFREQUENCY TAG IDENTIFICATION Left 05/25/2021   Procedure: BREAST LUMPECTOMY WITH RADIOFREQUENCY TAG IDENTIFICATION;  Surgeon: Campbell Lerner, MD;  Location: [facility];  Service: General;  Laterality: Left;   COLONOSCOPY WITH PROPOFOL N/A 06/09/2020   Procedure: COLONOSCOPY WITH PROPOFOL;  Surgeon: Pasty Spillers, MD;  Location: [facility];  Service: Endoscopy;  Laterality: N/A;   WISDOM TOOTH EXTRACTION      Social History   Tobacco Use   Smoking status: Former    Types: Cigarettes    Quit date: 08/27/2016    Years since quitting: 6.6   Smokeless tobacco: Never  Vaping Use   Vaping Use: Never used  Substance Use Topics   Alcohol use: Yes    Comment: occasionally   Drug use: Never     Medication list has been reviewed and updated.  Current Meds  Medication Sig   atorvastatin (LIPITOR) 10 MG tablet Take 1 tablet (10 mg total) by mouth daily.   fexofenadine (ALLEGRA) 180 MG tablet TAKE 1 TABLET BY MOUTH EVERY DAY       04/18/2023    2:20 PM 09/10/2022    2:18 PM 04/06/2021    3:56 PM 02/28/2021    8:06 AM  GAD 7 : Generalized Anxiety Score  Nervous, Anxious, on Edge 0 0 0 0  Control/stop worrying 0 0 0 0  Worry too much - different things 0 0 0 0  Trouble relaxing 0 0 0 0  Restless 0 0 0 0  Easily annoyed or irritable 0 0 0 0  Afraid - awful might happen 0 0 0 0  Total GAD 7 Score 0 0 0 0  Anxiety Difficulty Not difficult at all Not difficult at all         04/18/2023    2:20 PM 09/10/2022    2:17 PM 04/06/2021    3:56 PM  Depression screen PHQ 2/9  Decreased Interest 0 0 0  Down, Depressed, Hopeless 0 0 0  PHQ - 2 Score 0 0 0  Altered sleeping 0 0 0  Tired, decreased energy 0 0 0   Change in appetite 0 0 0  Feeling bad or failure about yourself  0 0 0  Trouble concentrating 0 0 0  Moving slowly or fidgety/restless 0 0 0  Suicidal thoughts 0  0  PHQ-9 Score 0 0 0  Difficult doing work/chores Not difficult at all Not difficult at all     BP Readings from Last 3 Encounters:  04/18/23 120/74  10/11/22 128/78  09/10/22 (!) 126/90    Physical Exam Vitals and nursing note reviewed. Exam conducted with a chaperone present.  Constitutional:      General: She is not in acute distress.    Appearance: She is not diaphoretic.  HENT:     Head: Normocephalic and atraumatic.     Right Ear: Tympanic membrane and external ear normal.     Left Ear: Tympanic membrane and external ear  normal.     Nose: Nose normal. No congestion or rhinorrhea.     Mouth/Throat:     Mouth: Mucous membranes are moist.  Eyes:     General:        Right eye: No discharge.        Left eye: No discharge.     Conjunctiva/sclera: Conjunctivae normal.     Pupils: Pupils are equal, round, and reactive to light.  Neck:     Thyroid: No thyromegaly.     Vascular: No JVD.  Cardiovascular:     Rate and Rhythm: Normal rate and regular rhythm.     Heart sounds: Normal heart sounds. No murmur heard.    No friction rub. No gallop.  Pulmonary:     Effort: Pulmonary effort is normal.     Breath sounds: Normal breath sounds. No wheezing, rhonchi or rales.  Abdominal:     General: Bowel sounds are normal.     Palpations: Abdomen is soft. There is no hepatomegaly, splenomegaly or mass.     Tenderness: There is no abdominal tenderness. There is no guarding.  Musculoskeletal:        General: Normal range of motion.     Cervical back: Normal range of motion and neck supple.  Lymphadenopathy:     Cervical: No cervical adenopathy.  Skin:    General: Skin is warm and dry.  Neurological:     Mental Status: She is alert.     Deep Tendon Reflexes: Reflexes are normal and symmetric.     Wt Readings from  Last 3 Encounters:  04/18/23 257 lb (116.6 kg)  10/11/22 245 lb (111.1 kg)  09/10/22 247 lb (112 kg)    BP 120/74   Pulse 100   Ht 5' (1.524 m)   Wt 257 lb (116.6 kg)   LMP  (LMP Unknown)   SpO2 97%   BMI 50.19 kg/m   Assessment and Plan: 1. Prediabetes Chronic.  Controlled.  Stable.  Currently diet controlled and we will check A1c for current level of control. - HgB A1c  2. Gastroesophageal reflux disease, unspecified whether esophagitis present Chronic.  Controlled.  Stable.  Insurance will no longer pay for omeprazole due to it being OTC.  Patient is a must to have a PPI on board due to uncontrolled reflux/heartburn.  Trial of pantoprazole 40 mg once a day to see if it is paid for by insurance. - pantoprazole (PROTONIX) 40 MG tablet; Take 1 tablet (40 mg total) by mouth daily.  Dispense: 30 tablet; Refill: 3  3. Mixed hyperlipidemia Chronic.  Controlled.  Stable.  Continue atorvastatin 10 mg once a day.  Will check CMP. - atorvastatin (LIPITOR) 10 MG tablet; Take 1 tablet (10 mg total) by mouth daily.  Dispense: 90 tablet; Refill: 1 - Comprehensive Metabolic Panel (CMET)  4. Chronic urticaria Chronic.  Controlled.  Stable.  Continue fexofenadine 180 mg once a day. - fexofenadine (ALLEGRA) 180 MG tablet; Take 1 tablet (180 mg total) by mouth daily.  Dispense: 90 tablet; Refill: 1  5. Intrinsic eczema Chronic.  Controlled.  Stable will continue fexofenadine 180 mg once a day. - fexofenadine (ALLEGRA) 180 MG tablet; Take 1 tablet (180 mg total) by mouth daily.  Dispense: 90 tablet; Refill: 1     Elizabeth Sauer, MD

## 2023-04-19 LAB — COMPREHENSIVE METABOLIC PANEL
ALT: 39 IU/L — ABNORMAL HIGH (ref 0–32)
AST: 50 IU/L — ABNORMAL HIGH (ref 0–40)
Albumin: 4.1 g/dL (ref 3.8–4.9)
Alkaline Phosphatase: 134 IU/L — ABNORMAL HIGH (ref 44–121)
BUN/Creatinine Ratio: 17 (ref 9–23)
BUN: 11 mg/dL (ref 6–24)
Bilirubin Total: 1.1 mg/dL (ref 0.0–1.2)
CO2: 21 mmol/L (ref 20–29)
Calcium: 9.2 mg/dL (ref 8.7–10.2)
Chloride: 102 mmol/L (ref 96–106)
Creatinine, Ser: 0.65 mg/dL (ref 0.57–1.00)
Globulin, Total: 3.4 g/dL (ref 1.5–4.5)
Glucose: 107 mg/dL — ABNORMAL HIGH (ref 70–99)
Potassium: 4.5 mmol/L (ref 3.5–5.2)
Sodium: 138 mmol/L (ref 134–144)
Total Protein: 7.5 g/dL (ref 6.0–8.5)
eGFR: 103 mL/min/{1.73_m2} (ref >59)

## 2023-04-19 LAB — HEMOGLOBIN A1C
Est. average glucose Bld gHb Est-mCnc: 148 mg/dL
Hgb A1c MFr Bld: 6.8 % — ABNORMAL HIGH (ref 4.8–5.6)

## 2023-05-06 ENCOUNTER — Encounter: Payer: Self-pay | Admitting: Family Medicine

## 2023-05-06 ENCOUNTER — Ambulatory Visit: Payer: 59 | Admitting: Family Medicine

## 2023-05-06 VITALS — BP 124/90 | HR 100 | Ht 60.0 in | Wt 249.0 lb

## 2023-05-06 DIAGNOSIS — L2084 Intrinsic (allergic) eczema: Secondary | ICD-10-CM

## 2023-05-06 DIAGNOSIS — R030 Elevated blood-pressure reading, without diagnosis of hypertension: Secondary | ICD-10-CM | POA: Diagnosis not present

## 2023-05-06 MED ORDER — LISINOPRIL 5 MG PO TABS
5.0000 mg | ORAL_TABLET | Freq: Every day | ORAL | 3 refills | Status: DC
Start: 2023-05-06 — End: 2023-10-20

## 2023-05-06 MED ORDER — TRIAMCINOLONE ACETONIDE 0.1 % EX CREA
1.0000 | TOPICAL_CREAM | Freq: Two times a day (BID) | CUTANEOUS | 0 refills | Status: DC
Start: 2023-05-06 — End: 2024-02-17

## 2023-05-06 NOTE — Patient Instructions (Signed)

## 2023-05-06 NOTE — Progress Notes (Signed)
Date:  05/06/2023   Name:  Julia Gallegos   DOB:  12-31-1965   MRN:  [id number]   Chief Complaint: Rash (On arms- itches, red bumpy)  Rash This is a chronic problem. The current episode started more than 1 year ago. The problem has been waxing and waning since onset. The affected locations include the left arm and right arm. The rash is characterized by itchiness and redness. She was exposed to nothing. Pertinent negatives include no congestion, diarrhea or shortness of breath. Past treatments include antihistamine. The treatment provided no relief. Her past medical history is significant for eczema.  Hypertension Pertinent negatives include no chest pain, headaches, neck pain, orthopnea, palpitations, PND or shortness of breath. There are no associated agents to hypertension. Risk factors for coronary artery disease include dyslipidemia. Past treatments include nothing. There are no compliance problems.  There is no history of CAD/MI or CVA.    Lab Results  Component Value Date   NA 138 04/18/2023   K 4.5 04/18/2023   CO2 21 04/18/2023   GLUCOSE 107 (H) 04/18/2023   BUN 11 04/18/2023   CREATININE 0.65 04/18/2023   CALCIUM 9.2 04/18/2023   EGFR 103 04/18/2023   GFRNONAA >60 05/21/2021   Lab Results  Component Value Date   CHOL 185 10/11/2022   HDL 39 (L) 10/11/2022   LDLCALC 129 (H) 10/11/2022   TRIG 91 10/11/2022   CHOLHDL 4.7 (H) 05/21/2022   Lab Results  Component Value Date   TSH 3.330 05/21/2022   Lab Results  Component Value Date   HGBA1C 6.8 (H) 04/18/2023   Lab Results  Component Value Date   WBC 7.8 05/21/2022   HGB 14.9 05/21/2022   HCT 42.4 05/21/2022   MCV 94 05/21/2022   PLT 230 05/21/2022   Lab Results  Component Value Date   ALT 39 (H) 04/18/2023   AST 50 (H) 04/18/2023   ALKPHOS 134 (H) 04/18/2023   BILITOT 1.1 04/18/2023   No results found for: "25OHVITD2", "25OHVITD3", "VD25OH"   Review of Systems  HENT:  Negative for congestion and trouble  swallowing.   Eyes:  Negative for visual disturbance.  Respiratory:  Negative for chest tightness, shortness of breath and wheezing.   Cardiovascular:  Negative for chest pain, palpitations, orthopnea, leg swelling and PND.  Gastrointestinal:  Negative for abdominal pain, blood in stool, constipation and diarrhea.  Endocrine: Negative for polydipsia and polyuria.  Genitourinary:  Negative for difficulty urinating.  Musculoskeletal:  Negative for neck pain.  Skin:  Positive for rash.  Neurological:  Negative for headaches.    Patient Active Problem List   Diagnosis Date Noted   Type 2 diabetes mellitus (HCC) 05/23/2022   Postmenopausal 05/23/2022   Radial scar of left breast 04/12/2021   Morbid obesity (HCC) 04/12/2021   Encounter for screening colonoscopy    Polyp of colon     Allergies  Allergen Reactions   Naproxen Nausea Only    Other reaction(s): Dizziness   Skin Adhesives [Cyanoacrylate] Other (See Comments)    Bandaids cause skin to blister   Vicodin [Hydrocodone-Acetaminophen] Itching    Past Surgical History:  Procedure Laterality Date   BREAST BIOPSY Left 04/03/2021   affirm bx, ribbon marker, radial scar   BREAST LUMPECTOMY WITH RADIOFREQUENCY TAG IDENTIFICATION Left 05/25/2021   Procedure: BREAST LUMPECTOMY WITH RADIOFREQUENCY TAG IDENTIFICATION;  Surgeon: Campbell Lerner, MD;  Location: [facility];  Service: General;  Laterality: Left;   COLONOSCOPY WITH PROPOFOL N/A 06/09/2020  Procedure: COLONOSCOPY WITH PROPOFOL;  Surgeon: Pasty Spillers, MD;  Location: [facility];  Service: Endoscopy;  Laterality: N/A;   WISDOM TOOTH EXTRACTION      Social History   Tobacco Use   Smoking status: Former    Types: Cigarettes    Quit date: 08/27/2016    Years since quitting: 6.6   Smokeless tobacco: Never  Vaping Use   Vaping Use: Never used  Substance Use Topics   Alcohol use: Yes    Comment: occasionally   Drug use: Never     Medication list has been  reviewed and updated.  Current Meds  Medication Sig   atorvastatin (LIPITOR) 10 MG tablet Take 1 tablet (10 mg total) by mouth daily.   fexofenadine (ALLEGRA) 180 MG tablet Take 1 tablet (180 mg total) by mouth daily.   pantoprazole (PROTONIX) 40 MG tablet Take 1 tablet (40 mg total) by mouth daily.       05/06/2023    8:16 AM 04/18/2023    2:20 PM 09/10/2022    2:18 PM 04/06/2021    3:56 PM  GAD 7 : Generalized Anxiety Score  Nervous, Anxious, on Edge 0 0 0 0  Control/stop worrying 0 0 0 0  Worry too much - different things 0 0 0 0  Trouble relaxing 0 0 0 0  Restless 0 0 0 0  Easily annoyed or irritable 0 0 0 0  Afraid - awful might happen 0 0 0 0  Total GAD 7 Score 0 0 0 0  Anxiety Difficulty Not difficult at all Not difficult at all Not difficult at all        05/06/2023    8:16 AM 04/18/2023    2:20 PM 09/10/2022    2:17 PM  Depression screen PHQ 2/9  Decreased Interest 0 0 0  Down, Depressed, Hopeless 0 0 0  PHQ - 2 Score 0 0 0  Altered sleeping 0 0 0  Tired, decreased energy 0 0 0  Change in appetite 0 0 0  Feeling bad or failure about yourself  0 0 0  Trouble concentrating 0 0 0  Moving slowly or fidgety/restless 0 0 0  Suicidal thoughts 0 0   PHQ-9 Score 0 0 0  Difficult doing work/chores Not difficult at all Not difficult at all Not difficult at all    BP Readings from Last 3 Encounters:  05/06/23 (!) 124/90  04/18/23 120/74  10/11/22 128/78    Physical Exam Vitals and nursing note reviewed.  HENT:     Right Ear: Tympanic membrane normal.     Left Ear: Tympanic membrane normal.     Nose: Nose normal. No congestion or rhinorrhea.     Mouth/Throat:     Mouth: Mucous membranes are moist.     Pharynx: No oropharyngeal exudate or posterior oropharyngeal erythema.  Eyes:     Pupils: Pupils are equal, round, and reactive to light.  Cardiovascular:     Rate and Rhythm: Normal rate and regular rhythm.     Heart sounds: No murmur heard.    No gallop.   Pulmonary:     Breath sounds: No wheezing, rhonchi or rales.  Abdominal:     Tenderness: There is no guarding.     Wt Readings from Last 3 Encounters:  05/06/23 249 lb (112.9 kg)  04/18/23 257 lb (116.6 kg)  10/11/22 245 lb (111.1 kg)    BP (!) 124/90 (BP Location: Right Arm, Cuff Size: Large)   Pulse  100   Ht 5' (1.524 m)   Wt 249 lb (112.9 kg)   LMP  (LMP Unknown)   SpO2 98%   BMI 48.63 kg/m   Assessment and Plan: 1. Intrinsic eczema Chronic.  Uncontrolled..  Relatively stable.  However is not being controlled with antihistamines.  I have suggested for her to continue her Allegra and take Benadryl nightly.  In addition to making a dermatology referral we will initiate triamcinolone 0.1% cream to be applied twice a day. - Ambulatory referral to Dermatology  2. Elevated blood pressure reading in office without diagnosis of hypertension Episodic.  Relatively stable.  Previous readings in June and December were acceptable.  Elevated today.  Patient is diabetic and it would be advantageous to be on lisinopril anyway so we will initiate at 5 mg once a day and will recheck in December. - lisinopril (ZESTRIL) 5 MG tablet; Take 1 tablet (5 mg total) by mouth daily.  Dispense: 90 tablet; Refill: 3     Elizabeth Sauer, MD

## 2023-05-26 ENCOUNTER — Ambulatory Visit
Admission: RE | Admit: 2023-05-26 | Discharge: 2023-05-26 | Disposition: A | Discharge status: Home / Self Care | Payer: 59 | Source: Ambulatory Visit | Attending: Family Medicine | Admitting: Family Medicine

## 2023-05-26 DIAGNOSIS — Z1231 Encounter for screening mammogram for malignant neoplasm of breast: Secondary | ICD-10-CM | POA: Insufficient documentation

## 2023-07-15 ENCOUNTER — Other Ambulatory Visit: Payer: Self-pay | Admitting: Family Medicine

## 2023-07-15 DIAGNOSIS — K219 Gastro-esophageal reflux disease without esophagitis: Secondary | ICD-10-CM

## 2023-07-20 ENCOUNTER — Other Ambulatory Visit: Payer: Self-pay | Admitting: Family Medicine

## 2023-07-20 DIAGNOSIS — K219 Gastro-esophageal reflux disease without esophagitis: Secondary | ICD-10-CM

## 2023-07-21 ENCOUNTER — Telehealth: Payer: Self-pay | Admitting: Family Medicine

## 2023-07-21 NOTE — Telephone Encounter (Signed)
Copied from CRM [id number]. Topic: General - Other >> Jul 21, 2023  2:07 PM Dondra Prader E wrote: Reason for CRM: Pt called reporting that her insurance has faxed over a form from her insurance regarding prior authorization for the lansoprazole (PREVACID) 30 MG capsule

## 2023-07-22 ENCOUNTER — Other Ambulatory Visit: Payer: Self-pay

## 2023-07-22 DIAGNOSIS — K219 Gastro-esophageal reflux disease without esophagitis: Secondary | ICD-10-CM

## 2023-07-22 MED ORDER — PANTOPRAZOLE SODIUM 40 MG PO TBEC
40.0000 mg | DELAYED_RELEASE_TABLET | Freq: Every day | ORAL | 1 refills | Status: DC
Start: 2023-07-22 — End: 2023-10-20

## 2023-07-22 NOTE — Progress Notes (Signed)
Pantoprazole sent in after PA approval

## 2023-08-06 DIAGNOSIS — Z6841 Body Mass Index (BMI) 40.0 and over, adult: Secondary | ICD-10-CM | POA: Diagnosis not present

## 2023-08-06 DIAGNOSIS — J209 Acute bronchitis, unspecified: Secondary | ICD-10-CM | POA: Diagnosis not present

## 2023-08-14 ENCOUNTER — Ambulatory Visit (INDEPENDENT_AMBULATORY_CARE_PROVIDER_SITE_OTHER): Payer: 59

## 2023-08-14 DIAGNOSIS — Z23 Encounter for immunization: Secondary | ICD-10-CM | POA: Diagnosis not present

## 2023-10-20 ENCOUNTER — Encounter: Payer: Self-pay | Admitting: Family Medicine

## 2023-10-20 ENCOUNTER — Ambulatory Visit: Payer: 59 | Admitting: Family Medicine

## 2023-10-20 VITALS — BP 128/78 | HR 108 | Ht 61.0 in | Wt 249.0 lb

## 2023-10-20 DIAGNOSIS — E782 Mixed hyperlipidemia: Secondary | ICD-10-CM

## 2023-10-20 DIAGNOSIS — R7303 Prediabetes: Secondary | ICD-10-CM | POA: Diagnosis not present

## 2023-10-20 DIAGNOSIS — K219 Gastro-esophageal reflux disease without esophagitis: Secondary | ICD-10-CM

## 2023-10-20 DIAGNOSIS — R030 Elevated blood-pressure reading, without diagnosis of hypertension: Secondary | ICD-10-CM

## 2023-10-20 MED ORDER — PANTOPRAZOLE SODIUM 40 MG PO TBEC
40.0000 mg | DELAYED_RELEASE_TABLET | Freq: Every day | ORAL | 1 refills | Status: DC
Start: 2023-10-20 — End: 2024-04-26

## 2023-10-20 MED ORDER — ATORVASTATIN CALCIUM 10 MG PO TABS
10.0000 mg | ORAL_TABLET | Freq: Every day | ORAL | 1 refills | Status: DC
Start: 2023-10-20 — End: 2024-02-17

## 2023-10-20 MED ORDER — LISINOPRIL 5 MG PO TABS
5.0000 mg | ORAL_TABLET | Freq: Every day | ORAL | 1 refills | Status: DC
Start: 2023-10-20 — End: 2023-10-20

## 2023-10-20 MED ORDER — LOSARTAN POTASSIUM 25 MG PO TABS: 25.0000 mg | ORAL_TABLET | Freq: Every day | ORAL | 1 refills | Status: DC

## 2023-10-20 NOTE — Progress Notes (Signed)
Date:  10/20/2023   Name:  Julia Gallegos   DOB:  July 19, 1966   MRN:  [id number]   Chief Complaint: Hyperlipidemia, Hypertension, and Diabetes  Hyperlipidemia This is a chronic problem. The current episode started more than 1 year ago. The problem is controlled. She has no history of chronic renal disease or hypothyroidism. There are no known factors aggravating her hyperlipidemia. Pertinent negatives include no chest pain, focal sensory loss, focal weakness, leg pain, myalgias or shortness of breath. Current antihyperlipidemic treatment includes statins. There are no compliance problems.  Risk factors for coronary artery disease include dyslipidemia.  Hypertension This is a chronic problem. The current episode started more than 1 year ago. The problem has been gradually improving since onset. The problem is controlled. Pertinent negatives include no anxiety, blurred vision, chest pain, headaches, malaise/fatigue, neck pain, orthopnea, palpitations, peripheral edema, PND, shortness of breath or sweats. There are no known risk factors for coronary artery disease. Past treatments include ACE inhibitors. The current treatment provides moderate improvement. There are no compliance problems.  There is no history of CAD/MI or CVA. There is no history of chronic renal disease, a hypertension causing med or renovascular disease.  Diabetes She presents for her follow-up diabetic visit. She has type 2 diabetes mellitus. Pertinent negatives for hypoglycemia include no dizziness, headaches, nervousness/anxiousness, speech difficulty or sweats. Pertinent negatives for diabetes include no blurred vision, no chest pain, no fatigue, no foot paresthesias, no foot ulcerations, no polydipsia, no polyphagia and no polyuria. Pertinent negatives for diabetic complications include no CVA. Risk factors for coronary artery disease include diabetes mellitus, dyslipidemia and hypertension. Her weight is stable. She is following a  generally healthy diet. Meal planning includes avoidance of concentrated sweets and carbohydrate counting. She participates in exercise intermittently. An ACE inhibitor/angiotensin II receptor blocker is being taken.    Lab Results  Component Value Date   NA 138 04/18/2023   K 4.5 04/18/2023   CO2 21 04/18/2023   GLUCOSE 107 (H) 04/18/2023   BUN 11 04/18/2023   CREATININE 0.65 04/18/2023   CALCIUM 9.2 04/18/2023   EGFR 103 04/18/2023   GFRNONAA >60 05/21/2021   Lab Results  Component Value Date   CHOL 185 10/11/2022   HDL 39 (L) 10/11/2022   LDLCALC 129 (H) 10/11/2022   TRIG 91 10/11/2022   CHOLHDL 4.7 (H) 05/21/2022   Lab Results  Component Value Date   TSH 3.330 05/21/2022   Lab Results  Component Value Date   HGBA1C 6.8 (H) 04/18/2023   Lab Results  Component Value Date   WBC 7.8 05/21/2022   HGB 14.9 05/21/2022   HCT 42.4 05/21/2022   MCV 94 05/21/2022   PLT 230 05/21/2022   Lab Results  Component Value Date   ALT 39 (H) 04/18/2023   AST 50 (H) 04/18/2023   ALKPHOS 134 (H) 04/18/2023   BILITOT 1.1 04/18/2023   No results found for: "25OHVITD2", "25OHVITD3", "VD25OH"   Review of Systems  Constitutional:  Negative for chills, fatigue, fever and malaise/fatigue.  HENT:  Negative for drooling, ear discharge, ear pain and sore throat.   Eyes:  Negative for blurred vision.  Respiratory:  Negative for cough, shortness of breath and wheezing.   Cardiovascular:  Negative for chest pain, palpitations, orthopnea, leg swelling and PND.  Gastrointestinal:  Negative for abdominal pain, blood in stool, constipation, diarrhea and nausea.  Endocrine: Negative for polydipsia, polyphagia and polyuria.  Genitourinary:  Negative for dysuria, frequency, hematuria and urgency.  Musculoskeletal:  Negative for back pain, myalgias and neck pain.  Skin:  Negative for rash.  Allergic/Immunologic: Negative for environmental allergies.  Neurological:  Negative for dizziness, focal  weakness, speech difficulty and headaches.  Hematological:  Does not bruise/bleed easily.  Psychiatric/Behavioral:  Negative for suicidal ideas. The patient is not nervous/anxious.     Patient Active Problem List   Diagnosis Date Noted   Type 2 diabetes mellitus (HCC) 05/23/2022   Postmenopausal 05/23/2022   Radial scar of left breast 04/12/2021   Morbid obesity (HCC) 04/12/2021   Encounter for screening colonoscopy    Polyp of colon     Allergies  Allergen Reactions   Naproxen Nausea Only    Other reaction(s): Dizziness   Skin Adhesives [Cyanoacrylate] Other (See Comments)    Bandaids cause skin to blister   Vicodin [Hydrocodone-Acetaminophen] Itching    Past Surgical History:  Procedure Laterality Date   BREAST BIOPSY Left 04/03/2021   affirm bx, ribbon marker, radial scar   BREAST LUMPECTOMY WITH RADIOFREQUENCY TAG IDENTIFICATION Left 05/25/2021   Procedure: BREAST LUMPECTOMY WITH RADIOFREQUENCY TAG IDENTIFICATION;  Surgeon: Campbell Lerner, MD;  Location: [facility];  Service: General;  Laterality: Left;   COLONOSCOPY WITH PROPOFOL N/A 06/09/2020   Procedure: COLONOSCOPY WITH PROPOFOL;  Surgeon: Pasty Spillers, MD;  Location: [facility];  Service: Endoscopy;  Laterality: N/A;   WISDOM TOOTH EXTRACTION      Social History   Tobacco Use   Smoking status: Former    Current packs/day: 0.00    Types: Cigarettes    Quit date: 08/27/2016    Years since quitting: 7.1   Smokeless tobacco: Never  Vaping Use   Vaping status: Never Used  Substance Use Topics   Alcohol use: Yes    Comment: occasionally   Drug use: Never     Medication list has been reviewed and updated.  Current Meds  Medication Sig   atorvastatin (LIPITOR) 10 MG tablet Take 1 tablet (10 mg total) by mouth daily.   fexofenadine (ALLEGRA) 180 MG tablet Take 1 tablet (180 mg total) by mouth daily.   lisinopril (ZESTRIL) 5 MG tablet Take 1 tablet (5 mg total) by mouth daily.   pantoprazole  (PROTONIX) 40 MG tablet Take 1 tablet (40 mg total) by mouth daily.   triamcinolone cream (KENALOG) 0.1 % Apply 1 Application topically 2 (two) times daily.       10/20/2023    2:27 PM 05/06/2023    8:16 AM 04/18/2023    2:20 PM 09/10/2022    2:18 PM  GAD 7 : Generalized Anxiety Score  Nervous, Anxious, on Edge 0 0 0 0  Control/stop worrying 0 0 0 0  Worry too much - different things 0 0 0 0  Trouble relaxing 0 0 0 0  Restless 0 0 0 0  Easily annoyed or irritable 0 0 0 0  Afraid - awful might happen 0 0 0 0  Total GAD 7 Score 0 0 0 0  Anxiety Difficulty Not difficult at all Not difficult at all Not difficult at all Not difficult at all       10/20/2023    2:33 PM 10/20/2023    2:27 PM 05/06/2023    8:16 AM  Depression screen PHQ 2/9  Decreased Interest 0 0 0  Down, Depressed, Hopeless 0 0 0  PHQ - 2 Score 0 0 0  Altered sleeping 0 0 0  Tired, decreased energy 2 0 0  Change in appetite 2 0  0  Feeling bad or failure about yourself  0 0 0  Trouble concentrating 0 0 0  Moving slowly or fidgety/restless 0 0 0  Suicidal thoughts 0 0 0  PHQ-9 Score 4 0 0  Difficult doing work/chores Not difficult at all Not difficult at all Not difficult at all    BP Readings from Last 3 Encounters:  10/20/23 128/78  05/06/23 (!) 124/90  04/18/23 120/74    Physical Exam Vitals and nursing note reviewed. Exam conducted with a chaperone present.  Constitutional:      General: She is not in acute distress.    Appearance: She is not diaphoretic.  HENT:     Head: Normocephalic and atraumatic.     Right Ear: Tympanic membrane and external ear normal.     Left Ear: Tympanic membrane and external ear normal.     Nose: Nose normal.     Mouth/Throat:     Mouth: Mucous membranes are moist.  Eyes:     General:        Right eye: No discharge.        Left eye: No discharge.     Conjunctiva/sclera: Conjunctivae normal.     Pupils: Pupils are equal, round, and reactive to light.  Neck:      Thyroid: No thyromegaly.     Vascular: No JVD.  Cardiovascular:     Rate and Rhythm: Normal rate and regular rhythm.     Heart sounds: Normal heart sounds. No murmur heard.    No friction rub. No gallop.  Pulmonary:     Effort: Pulmonary effort is normal.     Breath sounds: Normal breath sounds. No wheezing, rhonchi or rales.  Abdominal:     General: Bowel sounds are normal.     Palpations: Abdomen is soft. There is no mass.     Tenderness: There is no abdominal tenderness. There is no guarding or rebound.  Musculoskeletal:        General: Normal range of motion.     Cervical back: Normal range of motion and neck supple.  Lymphadenopathy:     Cervical: No cervical adenopathy.  Skin:    General: Skin is warm and dry.     Findings: No bruising, erythema or lesion.  Neurological:     Mental Status: She is alert.     Deep Tendon Reflexes: Reflexes are normal and symmetric.     Wt Readings from Last 3 Encounters:  10/20/23 249 lb (112.9 kg)  05/06/23 249 lb (112.9 kg)  04/18/23 257 lb (116.6 kg)    BP 128/78   Pulse (!) 108   Ht 5\' 1"  (1.549 m)   Wt 249 lb (112.9 kg)   LMP  (LMP Unknown)   SpO2 99%   BMI 47.05 kg/m   Assessment and Plan: 1. Elevated blood pressure reading in office without diagnosis of hypertension (Primary) Chronic.  Controlled.  Stable.  Blood pressure today 128/78.  Asymptomatic.  Tolerating medication well.  Continue losartan 25 mg once a day.  Will check CMP for electrolytes and GFR.  Will recheck patient in 4 months. - Comprehensive metabolic panel - losartan (COZAAR) 25 MG tablet; Take 1 tablet (25 mg total) by mouth daily.  Dispense: 90 tablet; Refill: 1  2. Mixed hyperlipidemia Chronic.  Controlled.  Stable.  Continue atorvastatin 10 mg once a day.  Will check CMP for electrolytes and GFR and in particular the transaminase involved patient is on a statin we will check lipid panel  for current level of LDL control as well as triglycerides. -  atorvastatin (LIPITOR) 10 MG tablet; Take 1 tablet (10 mg total) by mouth daily.  Dispense: 90 tablet; Refill: 1 - Comprehensive metabolic panel - Lipid Panel With LDL/HDL Ratio  3. Gastroesophageal reflux disease, unspecified whether esophagitis present Chronic.  Controlled.  Asymptomatic without dysphagia.  Continue pantoprazole 40 mg once a day.  Will recheck patient in 4 months. - pantoprazole (PROTONIX) 40 MG tablet; Take 1 tablet (40 mg total) by mouth daily.  Dispense: 90 tablet; Refill: 1  4. Prediabetes Chronic.  Controlled.  Stable.  Continue prediabetic control with diet discretion which means eliminating concentrated sweets and limiting carbohydrates.  Will check A1c for current level of control. - Hemoglobin A1c - Comprehensive metabolic panel     Elizabeth Sauer, MD

## 2023-11-05 ENCOUNTER — Ambulatory Visit: Payer: Self-pay

## 2023-11-05 NOTE — Telephone Encounter (Signed)
 Message from [place] C sent at 11/05/2023  8:29 AM EST  Summary: rx concern   The patient would like to speak with a member of staff when possible about their prescription for losartan  (COZAAR ) 25 MG tablet [[id number]]  The patient shares that they began to experience light headedness since being prescribed the medication  The patient stopped taking the medication on 11/01/23 and no longer experiences symptoms  Please contact the patient further when available         Chief Complaint: lightheadedness for taking Losartan  stopped taking it after 1 week (stopped 14/24) Symptoms: sx stopped when med was stopped Frequency: 10/22/23 - 11/01/23 Pertinent Negatives: Patient denies CP, SOB, stated feels normal today Disposition: [] ED /[] Urgent Care (no appt availability in office) / [x] Appointment(In office/virtual)/ []  [facility]/ [] Home Care/ [] Refused Recommended Disposition /[] [facility]/ []  Follow-up with PCP Additional Notes:   Reason for Disposition  [1] Caller has NON-URGENT medicine question about med that PCP prescribed AND [2] triager unable to answer question  Answer Assessment - Initial Assessment Questions 1. NAME of MEDICINE: What medicine(s) are you calling about?     Losartan   2. QUESTION: What is your question? (e.g., double dose of medicine, side effect)     Side effect 12/25 to the 11/01/23 3. PRESCRIBER: Who prescribed the medicine? Reason: if prescribed by specialist, call should be referred to that group.     PCP 4. SYMPTOMS: Do you have any symptoms? If Yes, ask: What symptoms are you having?  How bad are the symptoms (e.g., mild, moderate, severe)     Feel fine was lightheadedness  Protocols used: Medication Question Call-A-AH

## 2023-11-06 ENCOUNTER — Telehealth: Payer: 59 | Admitting: Family Medicine

## 2024-02-17 ENCOUNTER — Encounter: Payer: Self-pay | Admitting: Family Medicine

## 2024-02-17 ENCOUNTER — Ambulatory Visit: Payer: Self-pay | Admitting: Family Medicine

## 2024-02-17 VITALS — BP 138/98 | HR 104 | Ht 61.0 in | Wt 255.0 lb

## 2024-02-17 DIAGNOSIS — E782 Mixed hyperlipidemia: Secondary | ICD-10-CM

## 2024-02-17 DIAGNOSIS — Z1211 Encounter for screening for malignant neoplasm of colon: Secondary | ICD-10-CM

## 2024-02-17 DIAGNOSIS — R030 Elevated blood-pressure reading, without diagnosis of hypertension: Secondary | ICD-10-CM | POA: Diagnosis not present

## 2024-02-17 DIAGNOSIS — R7303 Prediabetes: Secondary | ICD-10-CM | POA: Diagnosis not present

## 2024-02-17 MED ORDER — LISINOPRIL 2.5 MG PO TABS
2.5000 mg | ORAL_TABLET | Freq: Every day | ORAL | 1 refills | Status: DC
Start: 2024-02-17 — End: 2024-06-22

## 2024-02-17 MED ORDER — ATORVASTATIN CALCIUM 10 MG PO TABS
10.0000 mg | ORAL_TABLET | Freq: Every day | ORAL | 1 refills | Status: DC
Start: 2024-02-17 — End: 2024-06-23

## 2024-02-17 NOTE — Progress Notes (Signed)
 Date:  02/17/2024   Name:  Julia Gallegos   DOB:  Dec 04, 1965   MRN:  [id number]   Chief Complaint: Prediabetes and Hypertension (Not taking any BP medication- side effects- dizziness)  Hypertension This is a chronic problem. The current episode started more than 1 year ago. The problem is uncontrolled. Pertinent negatives include no blurred vision, chest pain, headaches, orthopnea, palpitations, peripheral edema, PND or shortness of breath. There are no associated agents to hypertension. There are no known risk factors for coronary artery disease. Past treatments include nothing (lighthead losartan ). The current treatment provides moderate improvement. There is no history of CAD/MI, CVA or retinopathy. There is no history of chronic renal disease, a hypertension causing med or renovascular disease.  Diabetes She presents for her follow-up diabetic visit. Diabetes type: prediabetes. Hypoglycemia symptoms include dizziness. Pertinent negatives for hypoglycemia include no headaches. Pertinent negatives for diabetes include no blurred vision, no chest pain, no fatigue, no polydipsia and no polyuria. Symptoms are stable. Pertinent negatives for diabetic complications include no CVA, heart disease, impotence or retinopathy. Current diabetic treatment includes diet. She is following a generally healthy diet. Meal planning includes avoidance of concentrated sweets and carbohydrate counting. She participates in exercise intermittently. An ACE inhibitor/angiotensin II receptor blocker is being taken.    Lab Results  Component Value Date   NA 138 04/18/2023   K 4.5 04/18/2023   CO2 21 04/18/2023   GLUCOSE 107 (H) 04/18/2023   BUN 11 04/18/2023   CREATININE 0.65 04/18/2023   CALCIUM  9.2 04/18/2023   EGFR 103 04/18/2023   GFRNONAA >60 05/21/2021   Lab Results  Component Value Date   CHOL 185 10/11/2022   HDL 39 (L) 10/11/2022   LDLCALC 129 (H) 10/11/2022   TRIG 91 10/11/2022   CHOLHDL 4.7 (H)  05/21/2022   Lab Results  Component Value Date   TSH 3.330 05/21/2022   Lab Results  Component Value Date   HGBA1C 6.8 (H) 04/18/2023   Lab Results  Component Value Date   WBC 7.8 05/21/2022   HGB 14.9 05/21/2022   HCT 42.4 05/21/2022   MCV 94 05/21/2022   PLT 230 05/21/2022   Lab Results  Component Value Date   ALT 39 (H) 04/18/2023   AST 50 (H) 04/18/2023   ALKPHOS 134 (H) 04/18/2023   BILITOT 1.1 04/18/2023   No results found for: "25OHVITD2", "25OHVITD3", "VD25OH"   Review of Systems  Constitutional:  Negative for chills, fatigue and fever.  HENT:  Negative for trouble swallowing.   Eyes:  Negative for blurred vision.  Respiratory:  Negative for choking, shortness of breath and wheezing.   Cardiovascular:  Positive for leg swelling. Negative for chest pain, palpitations, orthopnea and PND.  Gastrointestinal:  Negative for blood in stool.  Endocrine: Negative for polydipsia and polyuria.  Genitourinary:  Negative for hematuria, impotence and vaginal bleeding.  Neurological:  Positive for dizziness. Negative for headaches.    Patient Active Problem List   Diagnosis Date Noted   Type 2 diabetes mellitus (HCC) 05/23/2022   Postmenopausal 05/23/2022   Radial scar of left breast 04/12/2021   Morbid obesity (HCC) 04/12/2021   Encounter for screening colonoscopy    Polyp of colon     Allergies  Allergen Reactions   Naproxen  Nausea Only    Other reaction(s): Dizziness   Skin Adhesives [Cyanoacrylate] Other (See Comments)    Bandaids cause skin to blister   Vicodin [Hydrocodone -Acetaminophen ] Itching    Past Surgical History:  Procedure Laterality Date   BREAST BIOPSY Left 04/03/2021   affirm bx, ribbon marker, radial scar   BREAST LUMPECTOMY WITH RADIOFREQUENCY TAG IDENTIFICATION Left 05/25/2021   Procedure: BREAST LUMPECTOMY WITH RADIOFREQUENCY TAG IDENTIFICATION;  Surgeon: Flynn Hylan, MD;  Location: [facility];  Service: General;  Laterality: Left;    COLONOSCOPY WITH PROPOFOL  N/A 06/09/2020   Procedure: COLONOSCOPY WITH PROPOFOL ;  Surgeon: Irby Mannan, MD;  Location: [facility];  Service: Endoscopy;  Laterality: N/A;   WISDOM TOOTH EXTRACTION      Social History   Tobacco Use   Smoking status: Former    Current packs/day: 0.00    Types: Cigarettes    Quit date: 08/27/2016    Years since quitting: 7.4   Smokeless tobacco: Never  Vaping Use   Vaping status: Never Used  Substance Use Topics   Alcohol use: Yes    Comment: occasionally   Drug use: Never     Medication list has been reviewed and updated.  Current Meds  Medication Sig   atorvastatin  (LIPITOR) 10 MG tablet Take 1 tablet (10 mg total) by mouth daily.   fexofenadine  (ALLEGRA ) 180 MG tablet Take 1 tablet (180 mg total) by mouth daily.   pantoprazole  (PROTONIX ) 40 MG tablet Take 1 tablet (40 mg total) by mouth daily.       02/17/2024    9:15 AM 10/20/2023    2:27 PM 05/06/2023    8:16 AM 04/18/2023    2:20 PM  GAD 7 : Generalized Anxiety Score  Nervous, Anxious, on Edge 0 0 0 0  Control/stop worrying 0 0 0 0  Worry too much - different things 0 0 0 0  Trouble relaxing 0 0 0 0  Restless 0 0 0 0  Easily annoyed or irritable 0 0 0 0  Afraid - awful might happen 0 0 0 0  Total GAD 7 Score 0 0 0 0  Anxiety Difficulty Not difficult at all Not difficult at all Not difficult at all Not difficult at all       02/17/2024    9:15 AM 10/20/2023    2:33 PM 10/20/2023    2:27 PM  Depression screen PHQ 2/9  Decreased Interest 0 0 0  Down, Depressed, Hopeless 0 0 0  PHQ - 2 Score 0 0 0  Altered sleeping  0 0  Tired, decreased energy  2 0  Change in appetite  2 0  Feeling bad or failure about yourself   0 0  Trouble concentrating  0 0  Moving slowly or fidgety/restless  0 0  Suicidal thoughts  0 0  PHQ-9 Score  4 0  Difficult doing work/chores  Not difficult at all Not difficult at all    BP Readings from Last 3 Encounters:  02/17/24 (!) 138/98   10/20/23 128/78  05/06/23 (!) 124/90    Physical Exam Vitals and nursing note reviewed.  Constitutional:      General: She is not in acute distress.    Appearance: She is not diaphoretic.  HENT:     Head: Normocephalic and atraumatic.     Right Ear: Tympanic membrane, ear canal and external ear normal.     Left Ear: Tympanic membrane, ear canal and external ear normal.     Nose: Nose normal.     Mouth/Throat:     Mouth: Mucous membranes are moist.  Eyes:     General:        Right eye: No discharge.  Left eye: No discharge.     Conjunctiva/sclera: Conjunctivae normal.     Pupils: Pupils are equal, round, and reactive to light.  Neck:     Thyroid : No thyromegaly.     Vascular: No JVD.  Cardiovascular:     Rate and Rhythm: Normal rate and regular rhythm.     Heart sounds: Normal heart sounds. No murmur heard.    No friction rub. No gallop.  Pulmonary:     Effort: Pulmonary effort is normal.     Breath sounds: Normal breath sounds. No wheezing, rhonchi or rales.  Abdominal:     General: Bowel sounds are normal.     Palpations: Abdomen is soft. There is no mass.     Tenderness: There is no abdominal tenderness. There is no guarding or rebound.  Musculoskeletal:        General: Normal range of motion.     Cervical back: Normal range of motion and neck supple.  Lymphadenopathy:     Cervical: No cervical adenopathy.  Skin:    General: Skin is warm and dry.  Neurological:     Mental Status: She is alert.     Deep Tendon Reflexes: Reflexes are normal and symmetric.     Wt Readings from Last 3 Encounters:  02/17/24 255 lb (115.7 kg)  10/20/23 249 lb (112.9 kg)  05/06/23 249 lb (112.9 kg)    BP (!) 138/98   Pulse (!) 104   Ht 5\' 1"  (1.549 m)   Wt 255 lb (115.7 kg)   LMP  (LMP Unknown)   SpO2 95%   BMI 48.18 kg/m   Assessment and Plan:  1. Prediabetes (Primary) .  Controlled.  Stable.  Asymptomatic.  Doing well with current dietary approach to avoid  concentrated sweets and carbohydrates.  Will check A1c for current level of control as well as microalbuminuria.  Will check renal function panel for electrolytes and GFR as well as fasting glucose although patient has had a sausage biscuit. - lisinopril  (ZESTRIL ) 2.5 MG tablet; Take 1 tablet (2.5 mg total) by mouth daily.  Dispense: 90 tablet; Refill: 1 - Hemoglobin A1c - Renal Function Panel - Microalbumin / creatinine urine ratio  2. Elevated blood pressure reading in office without diagnosis of hypertension Chronic.  Uncontrolled.  Stable.  Blood pressure elevated 138/98.  Patient did not take her medication and has not taking it because she had some lightheadedness and decided to stop on her own.  Blood pressure is remaining elevated and this was on losartan  25 mg once a day that she was unable to tolerate so we will go on lisinopril  2.5 mg once a day and encourage low-sodium intake.  Pressure in 4 weeks. - lisinopril  (ZESTRIL ) 2.5 MG tablet; Take 1 tablet (2.5 mg total) by mouth daily.  Dispense: 90 tablet; Refill: 1  3. Mixed hyperlipidemia Chronic.  Controlled.  Stable.  Will reemphasize a low triglyceride row cholesterol dietary guidelines.  Will continue atorvastatin  10 mg. - atorvastatin  (LIPITOR) 10 MG tablet; Take 1 tablet (10 mg total) by mouth daily.  Dispense: 90 tablet; Refill: 1  4. Colon cancer screening Patient was interested in pursuing getting colon cancer screening.  We will start with FIT test today and that she is still thinking about doing colonoscopy in the future. - Fecal occult blood, imunochemical    Alayne Allis, MD

## 2024-02-17 NOTE — Patient Instructions (Signed)

## 2024-02-18 DIAGNOSIS — Z1211 Encounter for screening for malignant neoplasm of colon: Secondary | ICD-10-CM | POA: Diagnosis not present

## 2024-02-18 LAB — RENAL FUNCTION PANEL
Albumin: 4.1 g/dL (ref 3.8–4.9)
BUN/Creatinine Ratio: 13 (ref 9–23)
BUN: 9 mg/dL (ref 6–24)
CO2: 18 mmol/L — ABNORMAL LOW (ref 20–29)
Calcium: 8.9 mg/dL (ref 8.7–10.2)
Chloride: 101 mmol/L (ref 96–106)
Creatinine, Ser: 0.67 mg/dL (ref 0.57–1.00)
Glucose: 290 mg/dL — ABNORMAL HIGH (ref 70–99)
Phosphorus: 2.3 mg/dL — ABNORMAL LOW (ref 3.0–4.3)
Potassium: 4.2 mmol/L (ref 3.5–5.2)
Sodium: 136 mmol/L (ref 134–144)
eGFR: 102 mL/min/{1.73_m2} (ref >59)

## 2024-02-18 LAB — HEMOGLOBIN A1C
Est. average glucose Bld gHb Est-mCnc: 171 mg/dL
Hgb A1c MFr Bld: 7.6 % — ABNORMAL HIGH (ref 4.8–5.6)

## 2024-02-18 NOTE — Progress Notes (Signed)
 Please call pt to schedule an appointment in 2 weeks.  KP

## 2024-02-21 LAB — FECAL OCCULT BLOOD, IMMUNOCHEMICAL: Fecal Occult Bld: NEGATIVE

## 2024-02-22 ENCOUNTER — Encounter: Payer: Self-pay | Admitting: Family Medicine

## 2024-02-23 ENCOUNTER — Encounter: Payer: Self-pay | Admitting: Family Medicine

## 2024-02-23 ENCOUNTER — Ambulatory Visit: Admitting: Family Medicine

## 2024-02-23 VITALS — BP 133/88 | HR 76 | Resp 16 | Ht 61.0 in | Wt 252.8 lb

## 2024-02-23 DIAGNOSIS — E119 Type 2 diabetes mellitus without complications: Secondary | ICD-10-CM | POA: Diagnosis not present

## 2024-02-23 DIAGNOSIS — Z7984 Long term (current) use of oral hypoglycemic drugs: Secondary | ICD-10-CM

## 2024-02-23 MED ORDER — METFORMIN HCL 500 MG PO TABS: 500.0000 mg | ORAL_TABLET | Freq: Two times a day (BID) | ORAL | 1 refills | Status: DC

## 2024-02-23 NOTE — Progress Notes (Signed)
 Date:  02/23/2024   Name:  Julia Gallegos   DOB:  20-Dec-1965   MRN:  [id number]   Chief Complaint: Prediabetes and Hypertension  Hypertension This is a chronic problem. The current episode started more than 1 year ago. The problem has been gradually improving since onset. The problem is controlled. Pertinent negatives include no anxiety, blurred vision, chest pain, headaches, orthopnea, palpitations, PND or shortness of breath. There are no associated agents to hypertension. Risk factors for coronary artery disease include dyslipidemia. Past treatments include ACE inhibitors. The current treatment provides moderate improvement. There is no history of CAD/MI or CVA.  Diabetes She presents for her follow-up diabetic visit. She has type 2 diabetes mellitus. Her disease course has been fluctuating. Pertinent negatives for hypoglycemia include no headaches. Pertinent negatives for diabetes include no blurred vision, no chest pain, no fatigue, no polydipsia, no polyphagia and no polyuria. There are no hypoglycemic complications. There are no diabetic complications. Pertinent negatives for diabetic complications include no CVA. Risk factors for coronary artery disease include dyslipidemia. Current diabetic treatment includes diet. She is compliant with treatment some of the time. She is following a generally healthy diet. Her home blood glucose trend is fluctuating minimally. An ACE inhibitor/angiotensin II receptor blocker is being taken.    Lab Results  Component Value Date   NA 136 02/17/2024   K 4.2 02/17/2024   CO2 18 (L) 02/17/2024   GLUCOSE 290 (H) 02/17/2024   BUN 9 02/17/2024   CREATININE 0.67 02/17/2024   CALCIUM  8.9 02/17/2024   EGFR 102 02/17/2024   GFRNONAA >60 05/21/2021   Lab Results  Component Value Date   CHOL 185 10/11/2022   HDL 39 (L) 10/11/2022   LDLCALC 129 (H) 10/11/2022   TRIG 91 10/11/2022   CHOLHDL 4.7 (H) 05/21/2022   Lab Results  Component Value Date   TSH  3.330 05/21/2022   Lab Results  Component Value Date   HGBA1C 7.6 (H) 02/17/2024   Lab Results  Component Value Date   WBC 7.8 05/21/2022   HGB 14.9 05/21/2022   HCT 42.4 05/21/2022   MCV 94 05/21/2022   PLT 230 05/21/2022   Lab Results  Component Value Date   ALT 39 (H) 04/18/2023   AST 50 (H) 04/18/2023   ALKPHOS 134 (H) 04/18/2023   BILITOT 1.1 04/18/2023   No results found for: "25OHVITD2", "25OHVITD3", "VD25OH"   Review of Systems  Constitutional:  Negative for fatigue.  Eyes:  Negative for blurred vision and visual disturbance.  Respiratory:  Negative for cough, chest tightness, shortness of breath, wheezing and stridor.   Cardiovascular:  Negative for chest pain, palpitations, orthopnea, leg swelling and PND.  Gastrointestinal:  Negative for abdominal distention and abdominal pain.  Endocrine: Negative for polydipsia, polyphagia and polyuria.  Neurological:  Negative for headaches.    Patient Active Problem List   Diagnosis Date Noted   Type 2 diabetes mellitus (HCC) 05/23/2022   Postmenopausal 05/23/2022   Radial scar of left breast 04/12/2021   Morbid obesity (HCC) 04/12/2021   Encounter for screening colonoscopy    Polyp of colon     Allergies  Allergen Reactions   Naproxen  Nausea Only    Other reaction(s): Dizziness   Skin Adhesives [Cyanoacrylate] Other (See Comments)    Bandaids cause skin to blister   Vicodin [Hydrocodone -Acetaminophen ] Itching    Past Surgical History:  Procedure Laterality Date   BREAST BIOPSY Left 04/03/2021   affirm bx, ribbon marker, radial scar  BREAST LUMPECTOMY WITH RADIOFREQUENCY TAG IDENTIFICATION Left 05/25/2021   Procedure: BREAST LUMPECTOMY WITH RADIOFREQUENCY TAG IDENTIFICATION;  Surgeon: Flynn Hylan, MD;  Location: [facility];  Service: General;  Laterality: Left;   COLONOSCOPY WITH PROPOFOL  N/A 06/09/2020   Procedure: COLONOSCOPY WITH PROPOFOL ;  Surgeon: Irby Mannan, MD;  Location: [facility];   Service: Endoscopy;  Laterality: N/A;   WISDOM TOOTH EXTRACTION      Social History   Tobacco Use   Smoking status: Former    Current packs/day: 0.00    Types: Cigarettes    Quit date: 08/27/2016    Years since quitting: 7.4   Smokeless tobacco: Never  Vaping Use   Vaping status: Never Used  Substance Use Topics   Alcohol use: Yes    Comment: occasionally   Drug use: Never     Medication list has been reviewed and updated.  Current Meds  Medication Sig   atorvastatin  (LIPITOR) 10 MG tablet Take 1 tablet (10 mg total) by mouth daily.   fexofenadine  (ALLEGRA ) 180 MG tablet Take 1 tablet (180 mg total) by mouth daily.   lisinopril  (ZESTRIL ) 2.5 MG tablet Take 1 tablet (2.5 mg total) by mouth daily.   pantoprazole  (PROTONIX ) 40 MG tablet Take 1 tablet (40 mg total) by mouth daily.       02/17/2024    9:15 AM 10/20/2023    2:27 PM 05/06/2023    8:16 AM 04/18/2023    2:20 PM  GAD 7 : Generalized Anxiety Score  Nervous, Anxious, on Edge 0 0 0 0  Control/stop worrying 0 0 0 0  Worry too much - different things 0 0 0 0  Trouble relaxing 0 0 0 0  Restless 0 0 0 0  Easily annoyed or irritable 0 0 0 0  Afraid - awful might happen 0 0 0 0  Total GAD 7 Score 0 0 0 0  Anxiety Difficulty Not difficult at all Not difficult at all Not difficult at all Not difficult at all       02/17/2024    9:15 AM 10/20/2023    2:33 PM 10/20/2023    2:27 PM  Depression screen PHQ 2/9  Decreased Interest 0 0 0  Down, Depressed, Hopeless 0 0 0  PHQ - 2 Score 0 0 0  Altered sleeping  0 0  Tired, decreased energy  2 0  Change in appetite  2 0  Feeling bad or failure about yourself   0 0  Trouble concentrating  0 0  Moving slowly or fidgety/restless  0 0  Suicidal thoughts  0 0  PHQ-9 Score  4 0  Difficult doing work/chores  Not difficult at all Not difficult at all    BP Readings from Last 3 Encounters:  02/23/24 133/88  02/17/24 (!) 138/98  10/20/23 128/78    Physical Exam Vitals  and nursing note reviewed.  Constitutional:      General: She is not in acute distress.    Appearance: She is not diaphoretic.  HENT:     Head: Normocephalic and atraumatic.     Right Ear: Tympanic membrane and external ear normal.     Left Ear: Tympanic membrane and external ear normal.     Nose: Nose normal.     Mouth/Throat:     Mouth: Mucous membranes are moist.  Eyes:     General:        Right eye: No discharge.        Left eye: No  discharge.     Conjunctiva/sclera: Conjunctivae normal.     Pupils: Pupils are equal, round, and reactive to light.  Neck:     Thyroid : No thyromegaly.     Vascular: No JVD.  Cardiovascular:     Rate and Rhythm: Normal rate and regular rhythm.     Heart sounds: Normal heart sounds. No murmur heard.    No friction rub. No gallop.  Pulmonary:     Effort: Pulmonary effort is normal.     Breath sounds: Normal breath sounds. No wheezing, rhonchi or rales.  Abdominal:     General: Bowel sounds are normal.     Palpations: Abdomen is soft. There is no mass.     Tenderness: There is no abdominal tenderness. There is no guarding.  Musculoskeletal:        General: Normal range of motion.     Cervical back: Normal range of motion and neck supple.  Lymphadenopathy:     Cervical: No cervical adenopathy.  Skin:    General: Skin is warm and dry.  Neurological:     Mental Status: She is alert.     Deep Tendon Reflexes: Reflexes are normal and symmetric.     Wt Readings from Last 3 Encounters:  02/23/24 252 lb 12.8 oz (114.7 kg)  02/17/24 255 lb (115.7 kg)  10/20/23 249 lb (112.9 kg)    BP 133/88   Pulse 76   Resp 16   Ht 5\' 1"  (1.549 m)   Wt 252 lb 12.8 oz (114.7 kg)   LMP  (LMP Unknown)   SpO2 99%   BMI 47.77 kg/m   Assessment and Plan:  1. Diabetes mellitus treated with oral medication (HCC) (Primary) Chronic.  Uncontrolled.  Stable.  Last A1c 7.6.  Patient has been hovering in controlled diabetes prediabetes for about a year and has  not been following dietary discretion as well as she should and this has been reemphasized in addition however we are going to be initiating metformin 500 mg twice a day.  I have reemphasized eliminating concentrated sweets and limiting carbohydrates.  We will check a microalbuminuria for current level of control although patient is on lisinopril  2.5 mg.  Patient is on a statin and we will recheck patient in 4 months at which time we will likely do a foot exam. - Urine Microalbumin w/creat. ratio.    Alayne Allis, MD

## 2024-02-24 LAB — MICROALBUMIN / CREATININE URINE RATIO
Creatinine, Urine: 50.5 mg/dL
Microalb/Creat Ratio: <6 mg/g{creat} (ref 0–29)
Microalbumin, Urine: <3 ug/mL

## 2024-02-25 ENCOUNTER — Encounter: Payer: Self-pay | Admitting: Family Medicine

## 2024-03-18 ENCOUNTER — Encounter: Payer: Self-pay | Admitting: Family Medicine

## 2024-03-18 ENCOUNTER — Ambulatory Visit: Admitting: Family Medicine

## 2024-03-18 VITALS — BP 138/84 | HR 79 | Ht 61.0 in | Wt 248.5 lb

## 2024-03-18 DIAGNOSIS — Z7984 Long term (current) use of oral hypoglycemic drugs: Secondary | ICD-10-CM | POA: Diagnosis not present

## 2024-03-18 DIAGNOSIS — E119 Type 2 diabetes mellitus without complications: Secondary | ICD-10-CM

## 2024-03-18 DIAGNOSIS — R739 Hyperglycemia, unspecified: Secondary | ICD-10-CM

## 2024-03-18 DIAGNOSIS — R030 Elevated blood-pressure reading, without diagnosis of hypertension: Secondary | ICD-10-CM | POA: Diagnosis not present

## 2024-03-18 DIAGNOSIS — R7303 Prediabetes: Secondary | ICD-10-CM

## 2024-03-18 MED ORDER — METFORMIN HCL 500 MG PO TABS: 500.0000 mg | ORAL_TABLET | Freq: Two times a day (BID) | ORAL | 1 refills | Status: DC

## 2024-03-18 NOTE — Patient Instructions (Signed)

## 2024-03-18 NOTE — Progress Notes (Signed)
 Date:  03/18/2024   Name:  Julia Gallegos   DOB:  [birth year]   MRN:  [id number]   Chief Complaint: Hypertension  Hypertension Pertinent negatives include no blurred vision, chest pain, headaches, palpitations, shortness of breath or sweats.  Diabetes She presents for her follow-up diabetic visit. Diabetes type: hyperglycemia. There are no hypoglycemic associated symptoms. Pertinent negatives for hypoglycemia include no headaches, hunger, nervousness/anxiousness, pallor, sleepiness or sweats. Pertinent negatives for diabetes include no blurred vision, no chest pain, no foot paresthesias, no polydipsia, no polyuria, no visual change, no weakness and no weight loss. There are no hypoglycemic complications. Symptoms are stable. There are no diabetic complications. Current diabetic treatment includes oral agent (monotherapy) and diet. She participates in exercise daily. Her breakfast blood glucose is taken between 6-7 am. An ACE inhibitor/angiotensin II receptor blocker is being taken.    Lab Results  Component Value Date   NA 136 02/17/2024   K 4.2 02/17/2024   CO2 18 (L) 02/17/2024   GLUCOSE 290 (H) 02/17/2024   BUN 9 02/17/2024   CREATININE 0.67 02/17/2024   CALCIUM  8.9 02/17/2024   EGFR 102 02/17/2024   GFRNONAA >60 05/21/2021   Lab Results  Component Value Date   CHOL 185 10/11/2022   HDL 39 (L) 10/11/2022   LDLCALC 129 (H) 10/11/2022   TRIG 91 10/11/2022   CHOLHDL 4.7 (H) 05/21/2022   Lab Results  Component Value Date   TSH 3.330 05/21/2022   Lab Results  Component Value Date   HGBA1C 7.6 (H) 02/17/2024   Lab Results  Component Value Date   WBC 7.8 05/21/2022   HGB 14.9 05/21/2022   HCT 42.4 05/21/2022   MCV 94 05/21/2022   PLT 230 05/21/2022   Lab Results  Component Value Date   ALT 39 (H) 04/18/2023   AST 50 (H) 04/18/2023   ALKPHOS 134 (H) 04/18/2023   BILITOT 1.1 04/18/2023   No results found for: "25OHVITD2", "25OHVITD3", "VD25OH"   Review of Systems   Constitutional:  Negative for weight loss.  Eyes:  Negative for blurred vision and visual disturbance.  Respiratory:  Negative for chest tightness, shortness of breath and wheezing.   Cardiovascular:  Negative for chest pain and palpitations.  Gastrointestinal:  Negative for abdominal pain and constipation.  Endocrine: Negative for polydipsia and polyuria.  Skin:  Negative for pallor.  Neurological:  Negative for weakness and headaches.  Psychiatric/Behavioral:  The patient is not nervous/anxious.     Patient Active Problem List   Diagnosis Date Noted   Type 2 diabetes mellitus (HCC) 05/23/2022   Postmenopausal 05/23/2022   Radial scar of left breast 04/12/2021   Morbid obesity (HCC) 04/12/2021   Encounter for screening colonoscopy    Polyp of colon     Allergies  Allergen Reactions   Naproxen  Nausea Only    Other reaction(s): Dizziness   Skin Adhesives [Cyanoacrylate] Other (See Comments)    Bandaids cause skin to blister   Vicodin [Hydrocodone -Acetaminophen ] Itching    Past Surgical History:  Procedure Laterality Date   BREAST BIOPSY Left 04/03/2021   affirm bx, ribbon marker, radial scar   BREAST LUMPECTOMY WITH RADIOFREQUENCY TAG IDENTIFICATION Left 05/25/2021   Procedure: BREAST LUMPECTOMY WITH RADIOFREQUENCY TAG IDENTIFICATION;  Surgeon: Flynn Hylan, MD;  Location: [facility];  Service: General;  Laterality: Left;   COLONOSCOPY WITH PROPOFOL  N/A 06/09/2020   Procedure: COLONOSCOPY WITH PROPOFOL ;  Surgeon: Irby Mannan, MD;  Location: [facility];  Service: Endoscopy;  Laterality: N/A;  WISDOM TOOTH EXTRACTION      Social History   Tobacco Use   Smoking status: Former    Current packs/day: 0.00    Types: Cigarettes    Quit date: 08/27/2016    Years since quitting: 7.5   Smokeless tobacco: Never  Vaping Use   Vaping status: Never Used  Substance Use Topics   Alcohol use: Yes    Comment: occasionally   Drug use: Never     Medication list  has been reviewed and updated.  Current Meds  Medication Sig   atorvastatin  (LIPITOR) 10 MG tablet Take 1 tablet (10 mg total) by mouth daily.   fexofenadine  (ALLEGRA ) 180 MG tablet Take 1 tablet (180 mg total) by mouth daily.   fluticasone (FLONASE) 50 MCG/ACT nasal spray Place 1 spray into both nostrils daily.   guaiFENesin (MUCINEX) 600 MG 12 hr tablet Take 600 mg by mouth 2 (two) times daily.   ibuprofen (ADVIL) 200 MG tablet Take 200 mg by mouth every 6 (six) hours as needed.   lisinopril  (ZESTRIL ) 2.5 MG tablet Take 1 tablet (2.5 mg total) by mouth daily.   metFORMIN  (GLUCOPHAGE ) 500 MG tablet Take 1 tablet (500 mg total) by mouth 2 (two) times daily with a meal.   pantoprazole  (PROTONIX ) 40 MG tablet Take 1 tablet (40 mg total) by mouth daily.       03/18/2024    9:25 AM 02/17/2024    9:15 AM 10/20/2023    2:27 PM 05/06/2023    8:16 AM  GAD 7 : Generalized Anxiety Score  Nervous, Anxious, on Edge 0 0 0 0  Control/stop worrying 0 0 0 0  Worry too much - different things 0 0 0 0  Trouble relaxing 0 0 0 0  Restless 0 0 0 0  Easily annoyed or irritable 0 0 0 0  Afraid - awful might happen 0 0 0 0  Total GAD 7 Score 0 0 0 0  Anxiety Difficulty Not difficult at all Not difficult at all Not difficult at all Not difficult at all       03/18/2024    9:25 AM 02/17/2024    9:15 AM 10/20/2023    2:33 PM  Depression screen PHQ 2/9  Decreased Interest 0 0 0  Down, Depressed, Hopeless 0 0 0  PHQ - 2 Score 0 0 0  Altered sleeping 0  0  Tired, decreased energy 0  2  Change in appetite 0  2  Feeling bad or failure about yourself  0  0  Trouble concentrating 0  0  Moving slowly or fidgety/restless 0  0  Suicidal thoughts 0  0  PHQ-9 Score 0  4  Difficult doing work/chores Not difficult at all  Not difficult at all    BP Readings from Last 3 Encounters:  03/18/24 138/84  02/23/24 133/88  02/17/24 (!) 138/98    Physical Exam Vitals and nursing note reviewed.  Constitutional:       Appearance: Normal appearance.  HENT:     Mouth/Throat:     Mouth: Mucous membranes are moist.  Cardiovascular:     Heart sounds: No murmur heard.    No friction rub. No gallop.  Pulmonary:     Breath sounds: No wheezing, rhonchi or rales.  Abdominal:     Tenderness: There is no abdominal tenderness. There is no guarding.  Musculoskeletal:     Cervical back: Normal range of motion.  Neurological:     Mental Status: She  is alert.     Wt Readings from Last 3 Encounters:  03/18/24 248 lb 8 oz (112.7 kg)  02/23/24 252 lb 12.8 oz (114.7 kg)  02/17/24 255 lb (115.7 kg)    BP 138/84   Pulse 79   Ht 5\' 1"  (1.549 m)   Wt 248 lb 8 oz (112.7 kg)   LMP  (LMP Unknown)   SpO2 98%   BMI 46.95 kg/m   Assessment and Plan:  1. Hyperglycemia (Primary) Chronic.  Relatively controlled with fasting blood sugars as reported by patient.  Stable.  Patient was started on metformin  500 mg twice a day after last visit it was noted that glucose was over 200.  Patient was given nutrition information and diabetes as well as started on the metformin .  Patient is tolerating well and we will continue and will have return in 4 months at which time we will check status of A1c. - metFORMIN  (GLUCOPHAGE ) 500 MG tablet; Take 1 tablet (500 mg total) by mouth 2 (two) times daily with a meal.  Dispense: 180 tablet; Refill: 1  2. Prediabetes Chronic.  Controlled.  Stable.  See above - metFORMIN  (GLUCOPHAGE ) 500 MG tablet; Take 1 tablet (500 mg total) by mouth 2 (two) times daily with a meal.  Dispense: 180 tablet; Refill: 1  3. Elevated blood pressure reading in office without diagnosis of hypertension Slightly elevated last visit and this was noted to be normal range at 138/84.  See above  4. Diabetes mellitus treated with oral medication (HCC) See above - metFORMIN  (GLUCOPHAGE ) 500 MG tablet; Take 1 tablet (500 mg total) by mouth 2 (two) times daily with a meal.  Dispense: 180 tablet; Refill: 1     Alayne Allis, MD

## 2024-04-26 ENCOUNTER — Other Ambulatory Visit: Payer: Self-pay

## 2024-04-26 DIAGNOSIS — K219 Gastro-esophageal reflux disease without esophagitis: Secondary | ICD-10-CM

## 2024-04-26 MED ORDER — PANTOPRAZOLE SODIUM 40 MG PO TBEC: 40.0000 mg | DELAYED_RELEASE_TABLET | Freq: Every day | ORAL | 0 refills | Status: DC

## 2024-06-22 ENCOUNTER — Ambulatory Visit: Admitting: Student

## 2024-06-22 ENCOUNTER — Encounter: Payer: Self-pay | Admitting: Student

## 2024-06-22 VITALS — BP 124/76 | HR 82 | Ht 61.0 in | Wt 247.0 lb

## 2024-06-22 DIAGNOSIS — I10 Essential (primary) hypertension: Secondary | ICD-10-CM | POA: Diagnosis not present

## 2024-06-22 DIAGNOSIS — K219 Gastro-esophageal reflux disease without esophagitis: Secondary | ICD-10-CM | POA: Insufficient documentation

## 2024-06-22 DIAGNOSIS — E785 Hyperlipidemia, unspecified: Secondary | ICD-10-CM | POA: Insufficient documentation

## 2024-06-22 DIAGNOSIS — Z1231 Encounter for screening mammogram for malignant neoplasm of breast: Secondary | ICD-10-CM

## 2024-06-22 DIAGNOSIS — E1169 Type 2 diabetes mellitus with other specified complication: Secondary | ICD-10-CM | POA: Insufficient documentation

## 2024-06-22 DIAGNOSIS — Z7984 Long term (current) use of oral hypoglycemic drugs: Secondary | ICD-10-CM

## 2024-06-22 DIAGNOSIS — E782 Mixed hyperlipidemia: Secondary | ICD-10-CM | POA: Diagnosis not present

## 2024-06-22 DIAGNOSIS — L2084 Intrinsic (allergic) eczema: Secondary | ICD-10-CM

## 2024-06-22 DIAGNOSIS — D126 Benign neoplasm of colon, unspecified: Secondary | ICD-10-CM

## 2024-06-22 LAB — POCT GLYCOSYLATED HEMOGLOBIN (HGB A1C): Hemoglobin A1C: 5.6 % (ref 4.0–5.6)

## 2024-06-22 MED ORDER — PANTOPRAZOLE SODIUM 40 MG PO TBEC: 40.0000 mg | DELAYED_RELEASE_TABLET | Freq: Every day | ORAL | 0 refills | Status: AC

## 2024-06-22 MED ORDER — LISINOPRIL 2.5 MG PO TABS: 2.5000 mg | ORAL_TABLET | Freq: Every day | ORAL | 1 refills | Status: AC

## 2024-06-22 MED ORDER — METFORMIN HCL ER 500 MG PO TB24: 500.0000 mg | ORAL_TABLET | Freq: Two times a day (BID) | ORAL | 1 refills | Status: DC

## 2024-06-22 MED ORDER — FEXOFENADINE HCL 180 MG PO TABS: 180.0000 mg | ORAL_TABLET | Freq: Every day | ORAL | 1 refills | Status: AC

## 2024-06-22 NOTE — Assessment & Plan Note (Addendum)
 Encouraged increasing physical activity and dietary modification. She will work on incorporating more vegetables and whole grains and limit pasta and bread.

## 2024-06-22 NOTE — Assessment & Plan Note (Signed)
 Well controlled on, lisinopril  2.5 mg daily. Continue current medication.

## 2024-06-22 NOTE — Assessment & Plan Note (Signed)
 Well controlled on pantoprazole  40 mg daily. Avoids acidic foods. No dysphagia, unintentional weight loss, n/v/, abdominal pain, melena, and hematochezia. SABRA

## 2024-06-22 NOTE — Patient Instructions (Signed)
 Please call to schedule your mammogram at [phone]

## 2024-06-22 NOTE — Progress Notes (Addendum)
 Established Patient Office Visit  Subjective   Patient ID: Julia Gallegos, female    DOB: 1966-05-03  Age: 58 y.o. MRN: [id number]  Chief Complaint  Patient presents with   Diabetes    Julia Gallegos with medical hx listed below presents today for transfer of care. PCP Dr. Joshua retired in May. Here for diabetes follow up. Reports fasting glucoses are improved typically <130. Denies hypoglycemic events.   Patient Active Problem List   Diagnosis Date Noted   GERD (gastroesophageal reflux disease) 06/22/2024   Hypertension 06/22/2024   HLD (hyperlipidemia) 06/22/2024   Type 2 diabetes mellitus (HCC) 05/23/2022   Postmenopausal 05/23/2022   Radial scar of left breast 04/12/2021   Morbid obesity (HCC) 04/12/2021   Encounter for screening colonoscopy    Polyp of colon       ROS Refer to HPI    Objective:     Outpatient Encounter Medications as of 06/22/2024  Medication Sig   atorvastatin  (LIPITOR) 10 MG tablet Take 1 tablet (10 mg total) by mouth daily.   metFORMIN  (GLUCOPHAGE -XR) 500 MG 24 hr tablet Take 1 tablet (500 mg total) by mouth 2 (two) times daily with a meal.   [DISCONTINUED] fexofenadine  (ALLEGRA ) 180 MG tablet Take 1 tablet (180 mg total) by mouth daily.   [DISCONTINUED] lisinopril  (ZESTRIL ) 2.5 MG tablet Take 1 tablet (2.5 mg total) by mouth daily.   [DISCONTINUED] metFORMIN  (GLUCOPHAGE ) 500 MG tablet Take 1 tablet (500 mg total) by mouth 2 (two) times daily with a meal.   [DISCONTINUED] pantoprazole  (PROTONIX ) 40 MG tablet Take 1 tablet (40 mg total) by mouth daily.   fexofenadine  (ALLEGRA ) 180 MG tablet Take 1 tablet (180 mg total) by mouth daily.   lisinopril  (ZESTRIL ) 2.5 MG tablet Take 1 tablet (2.5 mg total) by mouth daily.   pantoprazole  (PROTONIX ) 40 MG tablet Take 1 tablet (40 mg total) by mouth daily.   [DISCONTINUED] fluticasone (FLONASE) 50 MCG/ACT nasal spray Place 1 spray into both nostrils daily.   No facility-administered encounter medications on  file as of 06/22/2024.    BP 124/76   Pulse 82   Ht 5' 1 (1.549 m)   Wt 247 lb (112 kg)   LMP  (LMP Unknown)   SpO2 98%   BMI 46.67 kg/m  BP Readings from Last 3 Encounters:  06/22/24 124/76  03/18/24 138/84  02/23/24 133/88    Physical Exam Constitutional:      Appearance: Normal appearance.  HENT:     Head: Normocephalic and atraumatic.     Right Ear: Tympanic membrane normal.     Left Ear: Tympanic membrane normal.  Eyes:     Extraocular Movements: Extraocular movements intact.     Pupils: Pupils are equal, round, and reactive to light.  Cardiovascular:     Rate and Rhythm: Normal rate and regular rhythm.  Pulmonary:     Effort: Pulmonary effort is normal.     Breath sounds: No rhonchi or rales.  Abdominal:     General: Abdomen is flat. Bowel sounds are normal. There is no distension.     Palpations: Abdomen is soft.     Tenderness: There is no abdominal tenderness.  Musculoskeletal:        General: Normal range of motion.     Right lower leg: No edema.     Left lower leg: No edema.  Skin:    General: Skin is warm and dry.     Capillary Refill: Capillary refill takes less  than 2 seconds.  Neurological:     General: No focal deficit present.     Mental Status: She is alert and oriented to person, place, and time.  Psychiatric:        Mood and Affect: Mood normal.        Behavior: Behavior normal.        06/22/2024    8:07 AM 03/18/2024    9:25 AM 02/17/2024    9:15 AM  Depression screen PHQ 2/9  Decreased Interest 0 0 0  Down, Depressed, Hopeless 0 0 0  PHQ - 2 Score 0 0 0  Altered sleeping 1 0   Tired, decreased energy 1 0   Change in appetite 0 0   Feeling bad or failure about yourself  0 0   Trouble concentrating 0 0   Moving slowly or fidgety/restless 0 0   Suicidal thoughts 0 0   PHQ-9 Score 2 0   Difficult doing work/chores Not difficult at all Not difficult at all        06/22/2024    8:08 AM 03/18/2024    9:25 AM 02/17/2024    9:15 AM  10/20/2023    2:27 PM  GAD 7 : Generalized Anxiety Score  Nervous, Anxious, on Edge 0 0 0 0  Control/stop worrying 0 0 0 0  Worry too much - different things 0 0 0 0  Trouble relaxing 0 0 0 0  Restless 0 0 0 0  Easily annoyed or irritable 0 0 0 0  Afraid - awful might happen 0 0 0 0  Total GAD 7 Score 0 0 0 0  Anxiety Difficulty Not difficult at all Not difficult at all Not difficult at all Not difficult at all    Results for orders placed or performed in visit on 06/22/24  POCT glycosylated hemoglobin (Hb A1C)  Result Value Ref Range   Hemoglobin A1C 5.6 4.0 - 5.6 %    Last CBC Lab Results  Component Value Date   WBC 7.8 05/21/2022   HGB 14.9 05/21/2022   HCT 42.4 05/21/2022   MCV 94 05/21/2022   MCH 33.2 (H) 05/21/2022   RDW 11.2 (L) 05/21/2022   PLT 230 05/21/2022   Last metabolic panel Lab Results  Component Value Date   GLUCOSE 290 (H) 02/17/2024   NA 136 02/17/2024   K 4.2 02/17/2024   CL 101 02/17/2024   CO2 18 (L) 02/17/2024   BUN 9 02/17/2024   CREATININE 0.67 02/17/2024   EGFR 102 02/17/2024   CALCIUM  8.9 02/17/2024   PHOS 2.3 (L) 02/17/2024   PROT 7.5 04/18/2023   ALBUMIN 4.1 02/17/2024   LABGLOB 3.4 04/18/2023   AGRATIO 1.2 05/21/2022   BILITOT 1.1 04/18/2023   ALKPHOS 134 (H) 04/18/2023   AST 50 (H) 04/18/2023   ALT 39 (H) 04/18/2023   ANIONGAP 8 05/21/2021   Last lipids Lab Results  Component Value Date   CHOL 185 10/11/2022   HDL 39 (L) 10/11/2022   LDLCALC 129 (H) 10/11/2022   TRIG 91 10/11/2022   CHOLHDL 4.7 (H) 05/21/2022   Last hemoglobin A1c Lab Results  Component Value Date   HGBA1C 5.6 06/22/2024   Last thyroid  functions Lab Results  Component Value Date   TSH 3.330 05/21/2022      The 10-year ASCVD risk score (Arnett DK, et al., 2019) is: 7.2%    Assessment & Plan:  Gastroesophageal reflux disease, unspecified whether esophagitis present Assessment & Plan: Well controlled on  pantoprazole  40 mg daily. Avoids acidic  foods. No dysphagia, unintentional weight loss, n/v/, abdominal pain, melena, and hematochezia. .   Orders: -     Pantoprazole  Sodium; Take 1 tablet (40 mg total) by mouth daily.  Dispense: 90 tablet; Refill: 0  Type 2 diabetes mellitus with other specified complication, without long-term current use of insulin (HCC) Assessment & Plan: Current medication is metformin  500 mg twice daily. She reports some liquid stools with metformin  but feels it is tolerable. Last A1c 7.6%. Fasting CBG typically between 90-125 since last visit. A1c today is 5.6%, reports she was stressed due to planning a wedding at the beginning of the year but now much less stressed and eating a little better. Will send in XR metformin  to see if this improved GI symptoms. Discussed GLP-1 given side effects and for weight, she would prefer taking oral medications. Will discuss further at next visit.    Orders: -     POCT glycosylated hemoglobin (Hb A1C)  Primary hypertension Assessment & Plan: Well controlled on, lisinopril  2.5 mg daily. Continue current medication.   Orders: -     Lisinopril ; Take 1 tablet (2.5 mg total) by mouth daily.  Dispense: 90 tablet; Refill: 1  Mixed hyperlipidemia Assessment & Plan: Tolerating atorvastatin  10 mg daily. The 10-year ASCVD risk score (Arnett DK, et al., 2019) is: 7.2%. Reports she had coffee this morning, but otherwise fasting. Lipid panel today.   Orders: -     Lipid panel  Intrinsic eczema -     Fexofenadine  HCl; Take 1 tablet (180 mg total) by mouth daily.  Dispense: 90 tablet; Refill: 1  Screening mammogram for breast cancer -     3D Screening Mammogram, Left and Right  Adenomatous polyp of colon, unspecified part of colon Assessment & Plan: Coloscopy 06/09/2020 with 6 mm descending poylp and 3-4 mm sigmoid poylp, TA and hyperplastic polyp on pathology. Overdue for 3 year repeat. Referral made to GI for repeat colon.   Orders: -     Ambulatory referral to  Gastroenterology  Morbid obesity [facility]) Assessment & Plan: Encouraged increasing physical activity and dietary modification. She will work on incorporating more vegetables and whole grains and limit pasta and bread.    Other orders -     metFORMIN  HCl ER; Take 1 tablet (500 mg total) by mouth 2 (two) times daily with a meal.  Dispense: 180 tablet; Refill: 1     Return in about 4 months (around 10/22/2024) for DM.    Harlene Saddler, MD

## 2024-06-22 NOTE — Assessment & Plan Note (Addendum)
 Current medication is metformin  500 mg twice daily. She reports some liquid stools with metformin  but feels it is tolerable. Last A1c 7.6%. Fasting CBG typically between 90-125 since last visit. A1c today is 5.6%, reports she was stressed due to planning a wedding at the beginning of the year but now much less stressed and eating a little better. Will send in XR metformin  to see if this improved GI symptoms. Discussed GLP-1 given side effects and for weight, she would prefer taking oral medications. Will discuss further at next visit.

## 2024-06-22 NOTE — Assessment & Plan Note (Signed)
 Coloscopy 06/09/2020 with 6 mm descending poylp and 3-4 mm sigmoid poylp, TA and hyperplastic polyp on pathology. Overdue for 3 year repeat. Referral made to GI for repeat colon.

## 2024-06-22 NOTE — Assessment & Plan Note (Addendum)
 Tolerating atorvastatin  10 mg daily. The 10-year ASCVD risk score (Arnett DK, et al., 2019) is: 7.2%. Reports she had coffee this morning, but otherwise fasting. Lipid panel today.

## 2024-06-23 ENCOUNTER — Telehealth: Payer: Self-pay

## 2024-06-23 ENCOUNTER — Other Ambulatory Visit: Payer: Self-pay

## 2024-06-23 ENCOUNTER — Ambulatory Visit: Payer: Self-pay | Admitting: Student

## 2024-06-23 DIAGNOSIS — E782 Mixed hyperlipidemia: Secondary | ICD-10-CM

## 2024-06-23 DIAGNOSIS — Z860100 Personal history of colon polyps, unspecified: Secondary | ICD-10-CM

## 2024-06-23 LAB — LIPID PANEL
Chol/HDL Ratio: 2.8 ratio (ref 0.0–4.4)
Cholesterol, Total: 131 mg/dL (ref 100–199)
HDL: 46 mg/dL (ref >39)
LDL Chol Calc (NIH): 67 mg/dL (ref 0–99)
Triglycerides: 93 mg/dL (ref 0–149)
VLDL Cholesterol Cal: 18 mg/dL (ref 5–40)

## 2024-06-23 MED ORDER — NA SULFATE-K SULFATE-MG SULF 17.5-3.13-1.6 GM/177ML PO SOLN: 1.0000 | Freq: Once | ORAL | 0 refills | Status: AC

## 2024-06-23 MED ORDER — ATORVASTATIN CALCIUM 10 MG PO TABS: 10.0000 mg | ORAL_TABLET | Freq: Every day | ORAL | 1 refills | Status: AC

## 2024-06-23 NOTE — Telephone Encounter (Signed)
 Scheduled colonoscopy 10/07/24- instructed to hold Metformin  2 days prior- suprep sent to pharmacy- message sent to [facility].

## 2024-07-22 LAB — HM DIABETES EYE EXAM

## 2024-07-29 ENCOUNTER — Ambulatory Visit
Admission: RE | Admit: 2024-07-29 | Discharge: 2024-07-29 | Disposition: A | Discharge status: Home / Self Care | Source: Ambulatory Visit | Attending: Student | Admitting: Student

## 2024-07-29 DIAGNOSIS — Z1231 Encounter for screening mammogram for malignant neoplasm of breast: Secondary | ICD-10-CM | POA: Diagnosis present

## 2024-09-07 ENCOUNTER — Telehealth: Payer: Self-pay

## 2024-09-07 NOTE — Telephone Encounter (Signed)
 Call returned to patient to reschedule her 10/07/25 colonoscopy with Dr. Jinny.  Colonoscopy has been rescheduled to Monday 01/03/25.  Patsy in Endo has been asked to give message to Julia Gallegos.  Instructions updated.  Referral updated.  Thanks,  [place], CMA

## 2024-10-22 ENCOUNTER — Ambulatory Visit: Admitting: Student

## 2024-10-22 ENCOUNTER — Encounter: Payer: Self-pay | Admitting: Student

## 2024-10-22 VITALS — BP 124/80 | HR 91 | Ht 61.0 in | Wt 247.0 lb

## 2024-10-22 DIAGNOSIS — Z23 Encounter for immunization: Secondary | ICD-10-CM

## 2024-10-22 DIAGNOSIS — M170 Bilateral primary osteoarthritis of knee: Secondary | ICD-10-CM

## 2024-10-22 DIAGNOSIS — Z7984 Long term (current) use of oral hypoglycemic drugs: Secondary | ICD-10-CM

## 2024-10-22 DIAGNOSIS — E785 Hyperlipidemia, unspecified: Secondary | ICD-10-CM | POA: Diagnosis not present

## 2024-10-22 DIAGNOSIS — M179 Osteoarthritis of knee, unspecified: Secondary | ICD-10-CM | POA: Insufficient documentation

## 2024-10-22 DIAGNOSIS — E1169 Type 2 diabetes mellitus with other specified complication: Secondary | ICD-10-CM | POA: Diagnosis not present

## 2024-10-22 DIAGNOSIS — I10 Essential (primary) hypertension: Secondary | ICD-10-CM

## 2024-10-22 LAB — POCT GLYCOSYLATED HEMOGLOBIN (HGB A1C): Hemoglobin A1C: 6 % — AB (ref 4.0–5.6)

## 2024-10-22 NOTE — Assessment & Plan Note (Signed)
 Tolerating with ibuprofen as needed. Previously was going to have knee replacement in 2019 but was postponed due to weight. Given home exercises. Reports she did well with L knee steroid injection several years ago. She's would like to avoid knee injections for now.

## 2024-10-22 NOTE — Assessment & Plan Note (Addendum)
 Currently on metformin  500 mg twice daily. Loose stools are a little improved (less urgency) on metformin  extended release. Fasting glucoses <130.  A1c today is 6.0% from 5.6%. Continue current medication.

## 2024-10-22 NOTE — Assessment & Plan Note (Signed)
 Normotensive today. Obtain BMP.

## 2024-10-22 NOTE — Progress Notes (Signed)
 "  Established Patient Office Visit  Subjective   Patient ID: Julia Gallegos, female    DOB: [birth year]  Age: 58 y.o. MRN: [id number]  Chief Complaint  Patient presents with   Diabetes Mellitus    Julia Gallegos is a 58 y.o. person with medical hx listed below who presents today for follow up of diabetes. Please refer to problem based charting for further details and assessment and plan of current problem and chronic medical conditions.   Patient Active Problem List   Diagnosis Date Noted   OA (osteoarthritis) of knee 10/22/2024   GERD (gastroesophageal reflux disease) 06/22/2024   Hypertension 06/22/2024   Hyperlipidemia associated with type 2 diabetes mellitus (HCC) 06/22/2024   Type 2 diabetes mellitus (HCC) 05/23/2022   Postmenopausal 05/23/2022   Radial scar of left breast 04/12/2021   Morbid obesity (HCC) 04/12/2021   Encounter for screening colonoscopy    Polyp of colon     ROS Refer to HPI    Objective:     Outpatient Encounter Medications as of 10/22/2024  Medication Sig   atorvastatin  (LIPITOR) 10 MG tablet Take 1 tablet (10 mg total) by mouth daily.   fexofenadine  (ALLEGRA ) 180 MG tablet Take 1 tablet (180 mg total) by mouth daily.   lisinopril  (ZESTRIL ) 2.5 MG tablet Take 1 tablet (2.5 mg total) by mouth daily.   metFORMIN  (GLUCOPHAGE -XR) 500 MG 24 hr tablet Take 1 tablet (500 mg total) by mouth 2 (two) times daily with a meal.   pantoprazole  (PROTONIX ) 40 MG tablet Take 1 tablet (40 mg total) by mouth daily.   No facility-administered encounter medications on file as of 10/22/2024.    BP 124/80   Pulse 91   Ht 5' 1 (1.549 m)   Wt 247 lb (112 kg)   LMP  (LMP Unknown)   SpO2 95%   BMI 46.67 kg/m  BP Readings from Last 3 Encounters:  10/22/24 124/80  06/22/24 124/76  03/18/24 138/84    Physical Exam Constitutional:      Appearance: Normal appearance.  HENT:     Mouth/Throat:     Mouth: Mucous membranes are moist.     Pharynx: Oropharynx is  clear.  Cardiovascular:     Rate and Rhythm: Normal rate and regular rhythm.  Pulmonary:     Effort: Pulmonary effort is normal.     Breath sounds: No rhonchi or rales.  Abdominal:     General: Abdomen is flat. Bowel sounds are normal. There is no distension.     Palpations: Abdomen is soft.     Tenderness: There is no abdominal tenderness.  Musculoskeletal:        General: Normal range of motion.     Right lower leg: No edema.     Left lower leg: No edema.  Skin:    General: Skin is warm and dry.     Capillary Refill: Capillary refill takes less than 2 seconds.  Neurological:     General: No focal deficit present.     Mental Status: She is alert and oriented to person, place, and time.  Psychiatric:        Mood and Affect: Mood normal.        Behavior: Behavior normal.        10/22/2024    8:47 AM 06/22/2024    8:07 AM 03/18/2024    9:25 AM  Depression screen PHQ 2/9  Decreased Interest 0 0 0  Down, Depressed, Hopeless 0 0 0  PHQ -  2 Score 0 0 0  Altered sleeping  1 0  Tired, decreased energy  1 0  Change in appetite  0 0  Feeling bad or failure about yourself   0 0  Trouble concentrating  0 0  Moving slowly or fidgety/restless  0 0  Suicidal thoughts  0 0  PHQ-9 Score  2  0   Difficult doing work/chores  Not difficult at all Not difficult at all     Data saved with a previous flowsheet row definition       10/22/2024    8:47 AM 06/22/2024    8:08 AM 03/18/2024    9:25 AM 02/17/2024    9:15 AM  GAD 7 : Generalized Anxiety Score  Nervous, Anxious, on Edge 0 0 0 0  Control/stop worrying 0 0 0 0  Worry too much - different things  0 0 0  Trouble relaxing  0 0 0  Restless  0 0 0  Easily annoyed or irritable  0 0 0  Afraid - awful might happen  0 0 0  Total GAD 7 Score  0 0 0  Anxiety Difficulty  Not difficult at all Not difficult at all Not difficult at all    Results for orders placed or performed in visit on 10/22/24  POCT HgB A1C  Result Value Ref Range    Hemoglobin A1C 6.0 (A) 4.0 - 5.6 %    Last CBC Lab Results  Component Value Date   WBC 7.8 05/21/2022   HGB 14.9 05/21/2022   HCT 42.4 05/21/2022   MCV 94 05/21/2022   MCH 33.2 (H) 05/21/2022   RDW 11.2 (L) 05/21/2022   PLT 230 05/21/2022   Last metabolic panel Lab Results  Component Value Date   GLUCOSE 290 (H) 02/17/2024   NA 136 02/17/2024   K 4.2 02/17/2024   CL 101 02/17/2024   CO2 18 (L) 02/17/2024   BUN 9 02/17/2024   CREATININE 0.67 02/17/2024   EGFR 102 02/17/2024   CALCIUM  8.9 02/17/2024   PHOS 2.3 (L) 02/17/2024   PROT 7.5 04/18/2023   ALBUMIN 4.1 02/17/2024   LABGLOB 3.4 04/18/2023   AGRATIO 1.2 05/21/2022   BILITOT 1.1 04/18/2023   ALKPHOS 134 (H) 04/18/2023   AST 50 (H) 04/18/2023   ALT 39 (H) 04/18/2023   ANIONGAP 8 05/21/2021   Last lipids Lab Results  Component Value Date   CHOL 131 06/22/2024   HDL 46 06/22/2024   LDLCALC 67 06/22/2024   TRIG 93 06/22/2024   CHOLHDL 2.8 06/22/2024   Last hemoglobin A1c Lab Results  Component Value Date   HGBA1C 6.0 (A) 10/22/2024   Last thyroid  functions Lab Results  Component Value Date   TSH 3.330 05/21/2022   FREET4 1.15 05/21/2022    The 10-year ASCVD risk score (Arnett DK, et al., 2019) is: 4.6%    Assessment & Plan:  Type 2 diabetes mellitus with other specified complication, without long-term current use of insulin (HCC) Assessment & Plan: Currently on metformin  500 mg twice daily. Loose stools are a little improved (less urgency) on metformin  extended release. Fasting glucoses <130.  A1c today is 6.0% from 5.6%. Continue current medication.   Orders: -     POCT glycosylated hemoglobin (Hb A1C)  Primary hypertension Assessment & Plan: Normotensive today. Obtain BMP.   Orders: -     Basic metabolic panel with GFR  Encounter for immunization -     Flu vaccine trivalent PF, 6mos and older(Flulaval,Afluria,Fluarix,Fluzone)  Hyperlipidemia associated with type 2 diabetes mellitus  (HCC) Assessment & Plan: Well controlled on atorvastatin  10 mg daily.    Long term current use of oral hypoglycemic drug  Primary osteoarthritis of both knees Assessment & Plan: Tolerating with ibuprofen as needed. Previously was going to have knee replacement in 2019 but was postponed due to weight. Given home exercises. Reports she did well with L knee steroid injection several years ago. She's would like to avoid knee injections for now.       No follow-ups on file.    Harlene Saddler, MD "

## 2024-10-22 NOTE — Assessment & Plan Note (Signed)
 Well-controlled on atorvastatin 10 mg daily.

## 2024-10-23 LAB — BASIC METABOLIC PANEL WITH GFR
BUN/Creatinine Ratio: 21 (ref 9–23)
BUN: 13 mg/dL (ref 6–24)
CO2: 22 mmol/L (ref 20–29)
Calcium: 9.2 mg/dL (ref 8.7–10.2)
Chloride: 103 mmol/L (ref 96–106)
Creatinine, Ser: 0.63 mg/dL (ref 0.57–1.00)
Glucose: 125 mg/dL — ABNORMAL HIGH (ref 70–99)
Potassium: 3.9 mmol/L (ref 3.5–5.2)
Sodium: 140 mmol/L (ref 134–144)
eGFR: 103 mL/min/1.73

## 2024-10-29 ENCOUNTER — Ambulatory Visit: Payer: Self-pay | Admitting: Student

## 2024-11-25 ENCOUNTER — Encounter: Payer: Self-pay | Admitting: Student

## 2024-11-25 NOTE — Telephone Encounter (Signed)
 Please review. Appt?  KP

## 2024-11-26 ENCOUNTER — Telehealth: Admitting: Student

## 2024-11-26 ENCOUNTER — Encounter: Payer: Self-pay | Admitting: Student

## 2024-11-26 DIAGNOSIS — E1169 Type 2 diabetes mellitus with other specified complication: Secondary | ICD-10-CM | POA: Diagnosis not present

## 2024-11-26 DIAGNOSIS — Z7984 Long term (current) use of oral hypoglycemic drugs: Secondary | ICD-10-CM

## 2024-11-26 MED ORDER — EMPAGLIFLOZIN 10 MG PO TABS: 10.0000 mg | ORAL_TABLET | Freq: Every day | ORAL | 1 refills | Status: AC

## 2024-11-26 NOTE — Progress Notes (Signed)
" ° °  Virtual Visit via Video Note  I connected with Sari ONEIDA Santee on 11/26/24 at  9:40 AM EST by a video enabled telemedicine application and verified that I am speaking with the correct person using two identifiers.  Patient Location: Other:  Work [employer]ician: Office/Clinic  I discussed the limitations, risks, security, and privacy concerns of performing an evaluation and management service by video and the availability of in person appointments. I also discussed with the patient that there may be a patient responsible charge related to this service. The patient expressed understanding and agreed to proceed.  Subjective: PCP: Lemon Raisin, MD  Chief Complaint  Patient presents with   Medication Consultation    Metformin  XR - diarrhea, a lot of gas, wanting to switch to jardiance    Reports continued diarrhea  ROS: Per HPI Current Medications[1]  Observations/Objective: There were no vitals filed for this visit. Physical Exam Constitutional:      Appearance: Normal appearance.  HENT:     Head: Normocephalic and atraumatic.  Eyes:     Extraocular Movements: Extraocular movements intact.     Pupils: Pupils are equal, round, and reactive to light.  Pulmonary:     Effort: Pulmonary effort is normal.     Comments: Speaking in full sentences Neurological:     General: No focal deficit present.     Mental Status: She is alert. Mental status is at baseline.  Psychiatric:        Mood and Affect: Mood normal.        Behavior: Behavior normal.     Assessment and Plan: There are no diagnoses linked to this encounter.  Follow Up Instructions: No follow-ups on file.   I discussed the assessment and treatment plan with the patient. The patient was provided an opportunity to ask questions, and all were answered. The patient agreed with the plan and demonstrated an understanding of the instructions.   The patient was advised to call back or seek an in-person evaluation if the  symptoms worsen or if the condition fails to improve as anticipated.  The above assessment and management plan was discussed with the patient. The patient verbalized understanding of and has agreed to the management plan.   Raisin Lemon, MD  I personally spent a total of 20 minutes in the care of the patient today including preparing to see the patient, performing a medically appropriate exam/evaluation, placing orders, and documenting clinical information in the EHR.      [1]  Current Outpatient Medications:    atorvastatin  (LIPITOR) 10 MG tablet, Take 1 tablet (10 mg total) by mouth daily., Disp: 90 tablet, Rfl: 1   empagliflozin  (JARDIANCE ) 10 MG TABS tablet, Take 1 tablet (10 mg total) by mouth daily., Disp: 90 tablet, Rfl: 1   fexofenadine  (ALLEGRA ) 180 MG tablet, Take 1 tablet (180 mg total) by mouth daily., Disp: 90 tablet, Rfl: 1   lisinopril  (ZESTRIL ) 2.5 MG tablet, Take 1 tablet (2.5 mg total) by mouth daily., Disp: 90 tablet, Rfl: 1   pantoprazole  (PROTONIX ) 40 MG tablet, Take 1 tablet (40 mg total) by mouth daily., Disp: 90 tablet, Rfl: 0  "

## 2024-11-26 NOTE — Assessment & Plan Note (Signed)
 Unable to tolerate XR metformin  500 mg, decreasing dose has not helped and having more flatulence. Stop metformin , start jardiance  10 mg daily. Follow up if GI symptoms not improving with medication change.

## 2025-01-03 ENCOUNTER — Ambulatory Visit: Admit: 2025-01-03 | Admitting: Gastroenterology

## 2025-01-03 SURGERY — COLONOSCOPY
Anesthesia: General

## 2025-02-25 ENCOUNTER — Ambulatory Visit: Admitting: Student
